# Patient Record
Sex: Female | Born: 1995 | Race: Black or African American | Hispanic: No | Marital: Single | Smoking: Never smoker
Health system: Southern US, Community
[De-identification: ages and names within clinical notes are randomized; demographics above are authoritative.]

## PROBLEM LIST (undated history)

## (undated) ENCOUNTER — Inpatient Hospital Stay (HOSPITAL_COMMUNITY): Payer: Self-pay

## (undated) DIAGNOSIS — E119 Type 2 diabetes mellitus without complications: Secondary | ICD-10-CM

## (undated) HISTORY — DX: Type 2 diabetes mellitus without complications: E11.9

## (undated) HISTORY — PX: NO PAST SURGERIES: SHX2092

---

## 2017-07-10 LAB — HEMOGLOBIN A1C: Hemoglobin A1C: 13.4

## 2017-07-10 LAB — TSH: TSH: 2 (ref 0.41–5.90)

## 2017-07-10 LAB — BASIC METABOLIC PANEL: Creatinine: 0.8 (ref 0.5–1.1)

## 2017-07-31 ENCOUNTER — Encounter: Payer: 59 | Attending: Nurse Practitioner | Admitting: Registered"

## 2017-07-31 ENCOUNTER — Encounter: Payer: Self-pay | Admitting: Registered"

## 2017-07-31 DIAGNOSIS — Z713 Dietary counseling and surveillance: Secondary | ICD-10-CM | POA: Insufficient documentation

## 2017-07-31 DIAGNOSIS — E119 Type 2 diabetes mellitus without complications: Secondary | ICD-10-CM | POA: Insufficient documentation

## 2017-07-31 NOTE — Patient Instructions (Signed)
Goals:  Follow Diabetes Meal Plan as instructed  Eat 3 meals and 2 snacks, every 3-5 hrs  Limit carbohydrate intake to 30-45 grams carbohydrate/meal  Limit carbohydrate intake to 15 grams carbohydrate/snack  Add lean protein foods to meals/snacks  Monitor glucose levels as instructed by your doctor  Aim for 30 mins of physical activity daily  Bring food record and glucose log to your next nutrition visit  Follow up yearly with registered dietitian

## 2017-07-31 NOTE — Progress Notes (Signed)
Diabetes Self-Management Education  Visit Type:  First/Initial  Appt. Start Time: 11:15 Appt. End Time: 12:30  07/31/2017  [name], identified by name and date of birth, is a 22 y.o. female with a diagnosis of Diabetes: Type 2.    Pt expectations: wants to know the best way to make changes to a better way of eating with diabetes.    Meter Provided:  Yes  If Yes, Brand: AccuCheck  Lot #: [id number] Expiration Date: 08/09/2018 Checked blood sugar in office: 223  ASSESSMENT  There were no vitals taken for this visit. There is no height or weight on file to calculate BMI.   Diabetes Self-Management Education - 07/31/17 1122      Health Coping   How would you rate your overall health?  Fair      Psychosocial Assessment   Patient Belief/Attitude about Diabetes  Motivated to manage diabetes    Self-care barriers  None    Self-management support  Friends;Family    Special Needs  None    Preferred Learning Style  No preference indicated    Learning Readiness  Ready      Complications   Last HgB A1C per patient/outside source  13.4 %    How often do you check your blood sugar?  0 times/day (not testing)    Number of hypoglycemic episodes per month  0    Number of hyperglycemic episodes per week  0    Have you had a dilated eye exam in the past 12 months?  No    Have you had a dental exam in the past 12 months?  No    Are you checking your feet?  Yes    How many days per week are you checking your feet?  7      Dietary Intake   Breakfast  skips    Snack (morning)  none    Lunch  sometimes skips; Fast food - chicken tenders, fries    Snack (afternoon)  sometimes cheddar popcorn    Dinner   Fast food - chicken tenders, fries or granola bar, water    Snack (evening)  none    Beverage(s)  water, juice, sprite      Exercise   Exercise Type  ADL's    How many days per week to you exercise?  0    How many minutes per day do you exercise?  0    Total minutes per week  of exercise  0      Patient Education   Previous Diabetes Education  No    Disease state   Definition of diabetes, type 1 and 2, and the diagnosis of diabetes;Factors that contribute to the development of diabetes    Nutrition management   Carbohydrate counting;Role of diet in the treatment of diabetes and the relationship between the three main macronutrients and blood glucose level;Effects of alcohol on blood glucose and safety factors with consumption of alcohol.;Reviewed blood glucose goals for pre and post meals and how to evaluate the patients' food intake on their blood glucose level.;Meal options for control of blood glucose level and chronic complications.    Physical activity and exercise   Role of exercise on diabetes management, blood pressure control and cardiac health.    Monitoring  Taught/evaluated SMBG meter.;Purpose and frequency of SMBG.;Taught/discussed recording of test results and interpretation of SMBG.;Interpreting lab values - A1C, lipid, urine microalbumina.;Identified appropriate SMBG and/or A1C goals.;Daily foot exams;Yearly dilated eye exam  Acute complications  Taught treatment of hypoglycemia - the 15 rule.;Discussed and identified patients' treatment of hyperglycemia.    Chronic complications  Relationship between chronic complications and blood glucose control;Lipid levels, blood glucose control and heart disease;Dental care;Nephropathy, what it is, prevention of, the use of ACE, ARB's and early detection of through urine microalbumia.;Assessed and discussed foot care and prevention of foot problems;Retinopathy and reason for yearly dilated eye exams;Reviewed with patient heart disease, higher risk of, and prevention    Psychosocial adjustment  Role of stress on diabetes;Helped patient identify a support system for diabetes management    Preconception care  Role of family planning for patients with diabetes    Personal strategies to promote health  Lifestyle issues that  need to be addressed for better diabetes care      Individualized Goals (developed by patient)   Nutrition  Follow meal plan discussed;General guidelines for healthy choices and portions discussed    Medications  take my medication as prescribed    Monitoring   test my blood glucose as discussed    Reducing Risk  treat hypoglycemia with 15 grams of carbs if blood glucose less than /dL;do foot checks daily;examine blood glucose patterns    Health Coping  Not Applicable      Post-Education Assessment   Patient understands the diabetes disease and treatment process.  Demonstrates understanding / competency    Patient understands incorporating nutritional management into lifestyle.  Demonstrates understanding / competency    Patient undertands incorporating physical activity into lifestyle.  Demonstrates understanding / competency    Patient understands using medications safely.  Demonstrates understanding / competency    Patient understands monitoring blood glucose, interpreting and using results  Demonstrates understanding / competency    Patient understands prevention, detection, and treatment of acute complications.  Demonstrates understanding / competency    Patient understands prevention, detection, and treatment of chronic complications.  Demonstrates understanding / competency    Patient understands how to develop strategies to address psychosocial issues.  Demonstrates understanding / competency    Patient understands how to develop strategies to promote health/change behavior.  Demonstrates understanding / competency      Outcomes   Program Status  Completed       Learning Objective:  Patient will have a greater understanding of diabetes self-management. Patient education plan is to attend individual and/or group sessions per assessed needs and concerns.   Plan:   Patient Instructions  Goals:  Follow Diabetes Meal Plan as instructed  Eat 3 meals and 2 snacks, every 3-5  hrs  Limit carbohydrate intake to 30-45 grams carbohydrate/meal  Limit carbohydrate intake to 15 grams carbohydrate/snack  Add lean protein foods to meals/snacks  Monitor glucose levels as instructed by your doctor  Aim for 30 mins of physical activity daily  Bring food record and glucose log to your next nutrition visit  Follow up yearly with registered dietitian     Expected Outcomes:  Demonstrated interest in learning. Expect positive outcomes  Education material provided: Living Well with Diabetes, Support group flyer and Carbohydrate counting sheet  If problems or questions, patient to contact team via:  Phone and Email  Future DSME appointment: - Yearly

## 2017-09-05 ENCOUNTER — Encounter: Payer: Self-pay | Admitting: Endocrinology

## 2017-09-05 ENCOUNTER — Ambulatory Visit (INDEPENDENT_AMBULATORY_CARE_PROVIDER_SITE_OTHER): Payer: 59 | Admitting: Endocrinology

## 2017-09-05 VITALS — BP 138/82 | HR 94 | Ht 63.0 in | Wt 219.6 lb

## 2017-09-05 DIAGNOSIS — E1165 Type 2 diabetes mellitus with hyperglycemia: Secondary | ICD-10-CM | POA: Diagnosis not present

## 2017-09-05 LAB — BASIC METABOLIC PANEL
BUN: 7 mg/dL (ref 6–23)
CALCIUM: 9.6 mg/dL (ref 8.4–10.5)
CO2: 24 mEq/L (ref 19–32)
CREATININE: 0.76 mg/dL (ref 0.40–1.20)
Chloride: 103 mEq/L (ref 96–112)
GFR: 121.91 mL/min (ref >60.00)
GLUCOSE: 136 mg/dL — AB (ref 70–99)
Potassium: 4.5 mEq/L (ref 3.5–5.1)
SODIUM: 136 meq/L (ref 135–145)

## 2017-09-05 MED ORDER — METFORMIN HCL ER 500 MG PO TB24: 2000.0000 mg | ORAL_TABLET | Freq: Every day | ORAL | 3 refills | Status: DC

## 2017-09-05 MED ORDER — GLYBURIDE 2.5 MG PO TABS: 2.5000 mg | ORAL_TABLET | Freq: Every day | ORAL | 0 refills | Status: DC

## 2017-09-05 NOTE — Patient Instructions (Addendum)
Check blood sugars on waking up  daily  Also check blood sugars about 1-2 hours after each meal and do this after different meals by rotation  Recommended blood sugar levels on waking up is 70-90  and about 2 hours after meal is 110-130   Please bring your blood sugar monitor to each visit, thank you  Walking daily  Start taking Metformin 500 mg, 1 tablet with your main meal for 3-5 days. Occasionally this may initially cause loose stools or nausea. If  tolerating well after 3-5 days add a second Metformin tablet (500 mg) at the same time. Continue adding another tablet after 5 days days if no persistent nausea or diarrhea until reaching the maximum tolerated dose or the full dose of 4 tablets once daily.  Protein in am  Bedtime snack daily  Call if sugars still higher than above in 10 days or not tolerating Metformin

## 2017-09-05 NOTE — Progress Notes (Signed)
Patient ID: Angela Barry, female   DOB: 05/15/1995, 22 y.o.   MRN: [id number]          Reason for Appointment: Consultation for Type 2 Diabetes  Referring PCP: Sharion Dove FNP   History of Present Illness:          Date of diagnosis of type 2 diabetes mellitus:   06/2017      Background history:   She had [facility] and because of her family history of diabetes she had screening labs done which showed her blood sugar to be 310 and A1c of 15.4 She had some symptoms of increased thirst but not unusual fatigue, weight loss, blurred vision or excessive urination She was started on metformin and referred here  Recent history:    Non-insulin hypoglycemic drugs the patient is taking are: None  Current management, blood sugar patterns and problems identified:  Patient was diagnosed to have early pregnancy and is now [redacted] weeks pregnant  Apparently her [facility] told her to stop metformin at this point but no other treatment has been started   No further records are available, she has also been seen by nurse practitioner at the obstetric clinic with the [facility]  Also she was straining to have mild diarrhea with taking metformin although she was taking 850 mg in the morning on empty stomach  Patient has been instructed on taking her blood sugars with an Accu-Chek but she is checking her blood sugars only sporadically and none for the last week  She thinks her blood sugars have been about 200 and at the most coming down to 150  She has seen the dietitian about 4 weeks ago for meal planning  Subsequently has been eating out less but has not cut out regular soft drinks such as Sprite  She has not had any excessive thirst or urination recently  Blood sugar in the office today fasting was 133   Currently has poor understanding for diabetes and blood sugar targets especially during pregnancy        Side effects from  medications have been: Diarrhea from metformin  Compliance with the medical regimen: Poor   Glucose monitoring:  done <1 times a day         Glucometer:  Accu-Chek       Blood Glucose readings as above  Self-care: The diet that the patient has been following is: None    Meal times are:  dinner: 6 PM   Typical meal intake: Breakfast is none               Dietician visit, most recent:07/31/2017               Exercise:  Some walking, mostly going to work, 30 min about 2 or 3 times a week, not briskly  Weight history: Previous range 210-240  Wt Readings from Last 3 Encounters:  09/05/17 219 lb 9.6 oz (99.6 kg)    Glycemic control:    Lab Results  Component Value Date   HGBA1C 13.4 07/10/2017   Lab Results  Component Value Date   CREATININE 0.8 07/10/2017   No results found for: MICRALBCREAT  No results found for: FRUCTOSAMINE    Allergies as of 09/05/2017   Not on File     Medication List        Accurate as of 09/05/17  1:00 PM. Always use your most recent med list.  glyBURIDE 2.5 MG tablet Commonly known as:  DIABETA Take 1 tablet (2.5 mg total) by mouth daily with supper.   metFORMIN 500 MG 24 hr tablet Commonly known as:  GLUCOPHAGE-XR Take 4 tablets (2,000 mg total) by mouth daily with supper.   sertraline 50 MG tablet Commonly known as:  ZOLOFT Take 50 mg by mouth daily.       Allergies: Not on File  Past Medical History:  Diagnosis Date  . Diabetes mellitus without complication (HCC)     History reviewed. No pertinent surgical history.  Family History  Problem Relation Age of Onset  . Diabetes Father   . Diabetes Paternal Grandmother   . Hypertension Neg Hx     Social History:  reports that she has never smoked. She has never used smokeless tobacco. She reports that she drank alcohol. She reports that she does not use drugs.   Review of Systems  Constitutional: Negative for weight gain.  HENT: Negative for headaches.     Eyes: Negative for blurred vision.  Respiratory: Negative for shortness of breath.   Cardiovascular: Negative for leg swelling.  Gastrointestinal: Negative for nausea.  Endocrine: Negative for fatigue.  Genitourinary: Negative for frequency.  Musculoskeletal: Negative for joint pain.  Skin: Negative for rash.  Neurological: Negative for numbness and tingling.     Lipid history: No levels available   No results found for: CHOL, HDL, LDLCALC, LDLDIRECT, TRIG, CHOLHDL         Hypertension: No history of this  BP Readings from Last 3 Encounters:  09/05/17 138/82     Most recent foot exam: 6/19    LABS:  Office Visit on 09/05/2017  Component Date Value Ref Range Status  . Creatinine 07/10/2017 0.8  0.5 - 1.1 Final  . Hemoglobin A1C 07/10/2017 13.4   Final  . TSH 07/10/2017 2.00  0.41 - 5.90 Final    Physical Examination:  BP 138/82 (BP Location: Left Arm, Patient Position: Sitting, Cuff Size: Normal)   Pulse 94   Ht 5\' 3"  (1.6 m)   Wt 219 lb 9.6 oz (99.6 kg)   SpO2 96%   BMI 38.90 kg/m   GENERAL:         Patient has generalized obesity.    HEENT:         Eye exam shows normal external appearance.  Fundus exam shows no retinopathy.  Oral exam shows normal mucosa .  NECK:  She has moderate acanthosis of the posterior neck There is no lymphadenopathy Thyroid is not enlarged and no nodules felt.  Carotid exam shows no bruit   LUNGS:         Chest is symmetrical. Lungs are clear to auscultation.Marland Kitchen.   HEART:         Heart sounds:  S1 and S2 are normal. No murmur or click heard., no S3 or S4.   ABDOMEN:   There is no distention present. Liver and spleen are not palpable. No other mass or tenderness present.    NEUROLOGICAL:   Ankle jerks are absent bilaterally.    Diabetic Foot Exam - Simple   No data filed             Vibration sense is  reduced in distal first toes. MUSCULOSKELETAL:  There is no swelling or deformity of the peripheral joints.      EXTREMITIES:     There is no edema.  SKIN:       No rash  ASSESSMENT:  Diabetes type 2, recently diagnosed with baseline A1c 13.4  Currently on no medication for her diabetes Although her blood sugar was significantly higher in the 300 range diagnosis about 2 months ago her glucose is only 133 today She has been monitoring her blood sugar very sporadically in the morning only which reportedly readings as high as 200  Although she  has had a consultation with the dietitian she is really not planning her meals well and not cutting out regular soft drinks Currently with her pregnancy she is mostly eating 2 meals a day and no bedtime snack  Complications of diabetes: None evident   PLAN:    She will need to start checking her blood sugars at least 2-3 times a day including 1 to 2 hours after meals  Cut back on regular soft drinks  Follow-up consultation with dietitian with meal planning directed towards diet during pregnancy  Start regular walking for exercise  She will restart metformin but try extended release metformin 500 mg with gradually increasing doses until maximally tolerated dose reached or 1500 mg at least  Trial of glyburide 2.5 mg daily at suppertime to start with  However discussed that if she has fluctuating or consistently high readings especially fasting above 90 she will need to start basal insulin  Check fructosamine to establish current level of control and A1c on the next visit  She will call if blood sugars are persistently high over the next 10 days and start insulin with the help of the nurse educator, discussed that this may well be necessary as her pregnancy advances and her blood sugars go up  Patient Instructions  Check blood sugars on waking up  daily  Also check blood sugars about 1-2 hours after each meal and do this after different meals by rotation  Recommended blood sugar levels on waking up is 70-90  and about 2 hours after meal is  110-130   Please bring your blood sugar monitor to each visit, thank you  Walking daily  Start taking Metformin 500 mg, 1 tablet with your main meal for 3-5 days. Occasionally this may initially cause loose stools or nausea. If  tolerating well after 3-5 days add a second Metformin tablet (500 mg) at the same time. Continue adding another tablet after 5 days days if no persistent nausea or diarrhea until reaching the maximum tolerated dose or the full dose of 4 tablets once daily.  Protein in am  Bedtime snack daily  Call if sugars still higher than above in 10 days or not tolerating Metformin  Counseling time on subjects discussed in assessment and plan sections is over 50% of today's 60 minute visit   Consultation note has been sent to the referring physician  Reather Littler 09/05/2017, 1:00 PM   Note: This office note was prepared with Dragon voice recognition system technology. Any transcriptional errors that result from this process are unintentional.

## 2017-09-06 LAB — FRUCTOSAMINE: Fructosamine: 273 umol/L (ref 0–285)

## 2017-09-12 ENCOUNTER — Ambulatory Visit: Payer: 59 | Admitting: Endocrinology

## 2017-09-16 ENCOUNTER — Telehealth: Payer: Self-pay | Admitting: Dietician

## 2017-09-16 ENCOUNTER — Encounter: Payer: 59 | Admitting: Dietician

## 2017-09-16 NOTE — Telephone Encounter (Signed)
Patient did not show up for her nutrition appointment today. Called but unable to leave a message.  Oran ReinLaura Myrlene Riera, RD, LDN, CDE

## 2017-09-18 ENCOUNTER — Other Ambulatory Visit: Payer: Self-pay

## 2017-09-18 ENCOUNTER — Telehealth: Payer: Self-pay | Admitting: Dietician

## 2017-09-18 MED ORDER — LANCETS MISC: 3 refills | Status: DC

## 2017-09-18 NOTE — Telephone Encounter (Signed)
Brief Nutrition Note Called patient due to her missing her nutrition appointment Tuesday.  She stated that she knew that she had an appointment but did not know when.  We rescheduled this for tomorrow at 11:45. She is pregnant with uncontrolled blood sugars in the high 100's.  Newly diagnosed type 2 diabetes. Will message on-call endocrinologies. She is not yet scheduled with an OB due to insurance issues.  Oran ReinLaura [place], RD, LDN, CDE

## 2017-09-19 ENCOUNTER — Other Ambulatory Visit: Payer: Self-pay

## 2017-09-19 ENCOUNTER — Encounter: Payer: Self-pay | Admitting: Dietician

## 2017-09-19 ENCOUNTER — Telehealth: Payer: Self-pay | Admitting: Dietician

## 2017-09-19 ENCOUNTER — Encounter: Payer: 59 | Attending: Nurse Practitioner | Admitting: Dietician

## 2017-09-19 DIAGNOSIS — E119 Type 2 diabetes mellitus without complications: Secondary | ICD-10-CM | POA: Insufficient documentation

## 2017-09-19 DIAGNOSIS — O24119 Pre-existing diabetes mellitus, type 2, in pregnancy, unspecified trimester: Secondary | ICD-10-CM

## 2017-09-19 DIAGNOSIS — Z713 Dietary counseling and surveillance: Secondary | ICD-10-CM | POA: Diagnosis present

## 2017-09-19 MED ORDER — INSULIN GLARGINE 100 UNIT/ML SOLOSTAR PEN: 10.0000 [IU] | PEN_INJECTOR | Freq: Every day | SUBCUTANEOUS | 1 refills | Status: DC

## 2017-09-19 MED ORDER — GLUCOSE BLOOD VI STRP: ORAL_STRIP | 3 refills | Status: DC

## 2017-09-19 MED ORDER — BASAGLAR KWIKPEN 100 UNIT/ML ~~LOC~~ SOPN: 10.0000 [IU] | PEN_INJECTOR | Freq: Every day | SUBCUTANEOUS | 1 refills | Status: DC

## 2017-09-19 NOTE — Patient Instructions (Addendum)
Call your student [facility] and get a referral for the [facility] clinic at [facility]. [phone] You need a prescription for the Accu Chek Guide Fast Clicks drums Call your psychiatrist today and let them know about your symptoms. Make an appointment with a counselor  Walk or swim daily. Put the Calorie Brooke DareKing App on your phone Take your medication as prescribed. Eat 3 meals and 3 snacks daily.  See your meal plan card for portions. Each meal and snack needs to have carbohydrate and protein.

## 2017-09-19 NOTE — Telephone Encounter (Signed)
Brief Nutrition Note: Called patient and stated that the prescription for the Drums for her lancing devise and insulin have been called into the pharmacy.  Also, told her to start taking the Metformin again.  Insulin instructions:  Take 10 units Basaglar each night at about the same time.  Increase 2 units every 2 days until the morning fasting blood sugar is less than 100 and her blood sugar 2 hours after her meals is less than 130.   Patient to call if questions.  Oran ReinLaura Uel David[place], RD, LDN, CDE

## 2017-09-21 NOTE — Progress Notes (Signed)
Diabetes Self-Management Education  Visit Type: Follow-up  Appt. Start Time: 1200 Appt. End Time: 1330  09/21/2017  [name], identified by name and date of birth, is a 22 y.o. female with a diagnosis of Diabetes: Type 2. Since last visit with RD 1 month ago, patient  Is now [redacted] weeks pregnant.  She was seen at [facility] and was told to stop taking the Metformin as it was not appropriate during pregnancy.  She did not take the glyburide as she did not have the money.  Her fasting blood sugar has been 120-150 and post meal >200 when she checked earlier in the week.  Currently she is out of drums for the lancing device.    She missed her appointment earlier this week.  She is seen by Dr. Lucianne MussKumar and discussed with endocrinologist on call.  [name]  [name]  She works at [employer].  Her father has diabetes and is on an injectable medication.  She has no OB yet and just got Pregnancy Medicaid.    Prescription called in for Basaglar 10 units each HS and increase 2 units every 2 days until fasting CBG <100 and CBG 2 hours after meals is <130.  She is to resume the Metformin.  She is going to see if her mother can help with the cost of the Glyburide.   Pincus LargeBeverly Barry, IowaRD, CDE completed insulin instruction with patient including differences between long and short acting insulin, rotation of injection sites, and injection.  Patient demonstrated injection and verbalized understanding.  Called patient to let her know of available prescription at her pharmacy.  Gave her information to obtain OB. Also patient noted feelings of self harm but verbalized that she did not have these thoughts today.  Told her to call her psychiatrist and [facility] number provided.  Her antidepressant was recently changed due to  pregnancy.  ASSESSMENT  Weight 220 lb (99.8 kg). Body mass index is 38.97 kg/m.  Diabetes Self-Management Education - 09/21/17 0842      Visit Information   Visit Type  Follow-up      Initial Visit   Diabetes Type  Type 2    Are you currently following a meal plan?  No    Are you taking your medications as prescribed?  No    Date Diagnosed  07/14/17      Psychosocial Assessment   Patient Belief/Attitude about Diabetes  Motivated to manage diabetes but barriers    Patient Concerns  Nutrition/Meal planning;Other (comment);Glycemic Control diabetes and pregnancy    Special Needs  None    Preferred Learning Style  No preference indicated    Learning Readiness  Ready    How often do you need to have someone help you when you read instructions, pamphlets, or other written materials from your doctor or pharmacy?  1 - Never    What is the last grade level you completed in school?  just finished junior year of college      Pre-Education Assessment   Patient understands the diabetes disease and treatment process.  Needs Review    Patient understands incorporating nutritional management into lifestyle.  Needs Review    Patient undertands incorporating physical activity into lifestyle.  Needs Review    Patient understands using medications safely.  Needs Review    Patient understands monitoring  blood glucose, interpreting and using results  Needs Review    Patient understands prevention, detection, and treatment of acute complications.  Needs Review    Patient understands prevention, detection, and treatment of chronic complications.  Needs Review    Patient understands how to develop strategies to address psychosocial issues.  Needs Review    Patient understands how to develop strategies to promote health/change behavior.  Needs Review      Complications   Last HgB A1C per patient/outside source  13.4 % 06/2017    How often do you check your blood sugar?  0 times/day (not testing)  currently does not have drums for her lancing devise    Fasting Blood glucose range (mg/dL)  96-045;[phone] last week    Postprandial Blood glucose range (mg/dL)  >956 last week    Number of hypoglycemic episodes per month  0    Number of hyperglycemic episodes per week  14      Dietary Intake   Breakfast  Hot pocket, 6 pack of PB crackers    Snack (morning)  none    Lunch  fast food (chicken strips and fries)    Snack (afternoon)  yogurt    Dinner  spaghetti and meat balls, veges OR chicken, potatoes OR salad    Snack (evening)  popcorn    Beverage(s)  water, powerade, regular sprite, whole milk      Exercise   Exercise Type  ADL's      Patient Education   Previous Diabetes Education  Yes (please comment) 1 month ago    Nutrition management   Role of diet in the treatment of diabetes and the relationship between the three main macronutrients and blood glucose level;Food label reading, portion sizes and measuring food.;Meal timing in regards to the patients' current diabetes medication.;Information on hints to eating out and maintain blood glucose control.;Carbohydrate counting    Physical activity and exercise   Role of exercise on diabetes management, blood pressure control and cardiac health.;Identified with patient nutritional and/or medication changes necessary with exercise.    Medications  Reviewed patients medication for diabetes, action, purpose, timing of dose and side effects.    Monitoring  Purpose and frequency of SMBG.    Acute complications  Taught treatment of hypoglycemia - the 15 rule.    Psychosocial adjustment  Worked with patient to identify barriers to care and solutions    Preconception care  Reviewed with patient blood glucose goals with pregnancy    Personal strategies to promote health  Lifestyle issues that need to be addressed for better diabetes care      Individualized Goals (developed by patient)   Nutrition  Follow meal plan discussed;General  guidelines for healthy choices and portions discussed    Physical Activity  Exercise 3-5 times per week;30 minutes per day    Medications  take my medication as prescribed    Monitoring   test my blood glucose as discussed    Problem Solving  taking medication, checking blood sugar    Reducing Risk  examine blood glucose patterns    Health Coping  discuss diabetes with (comment) MD, Rd, CDE      Patient Self-Evaluation of Goals - Patient rates self as meeting previously set goals (% of time)   Nutrition  25 - 50%    Physical Activity  < 25%    Medications  < 25%    Problem Solving  < 25%    Reducing Risk  < 25%  Health Coping  >75%      Post-Education Assessment   Patient understands the diabetes disease and treatment process.  Demonstrates understanding / competency    Patient understands incorporating nutritional management into lifestyle.  Demonstrates understanding / competency    Patient undertands incorporating physical activity into lifestyle.  Demonstrates understanding / competency    Patient understands using medications safely.  Demonstrates understanding / competency    Patient understands monitoring blood glucose, interpreting and using results  Demonstrates understanding / competency    Patient understands prevention, detection, and treatment of acute complications.  Demonstrates understanding / competency    Patient understands prevention, detection, and treatment of chronic complications.  Demonstrates understanding / competency    Patient understands how to develop strategies to address psychosocial issues.  Demonstrates understanding / competency    Patient understands how to develop strategies to promote health/change behavior.  Demonstrates understanding / competency      Outcomes   Expected Outcomes  Other (comment) demonstrates interest in learning but question followthrough     Future DMSE  PRN    Program Status  Completed      Subsequent Visit   Since your  last visit have you continued or begun to take your medications as prescribed?  No       Individualized Plan for Diabetes Self-Management Training:   Learning Objective:  Patient will have a greater understanding of diabetes self-management. Patient education plan is to attend individual and/or group sessions per assessed needs and concerns.   Plan:   Patient Instructions  Call your student health center and get a referral for the high risk clinic at [place]. [phone] You need a prescription for the Accu Chek Guide Fast Clicks drums Call your psychiatrist today and let them know about your symptoms. Make an appointment with a counselor  Walk or swim daily. Put the Calorie Brooke Dare App on your phone Take your medication as prescribed. Eat 3 meals and 3 snacks daily.  See your meal plan card for portions. Each meal and snack needs to have carbohydrate and protein.         Expected Outcomes:  Other (comment)(demonstrates interest in learning but question followthrough )  Education material provided: Meal plan card, Diabetes during pregnancy  If problems or questions, patient to contact team via:  Phone  Future DSME appointment: PRN

## 2017-09-23 ENCOUNTER — Telehealth: Payer: Self-pay | Admitting: Dietician

## 2017-09-23 NOTE — Telephone Encounter (Signed)
Brief Nutrition Note Called patient this afternoon to follow up.  She reports that she is taking 10 units of Basaglar.  Her am blood sugar was 179.  Instructed her to increase the Basaglar to 12 units tonight and increase it 2 units every couple of days until her am blood sugar was <100.  Patient verbalized understanding.  [facility] but is not yet established as a patient but she states that it is in process.  She had no questions at this time.  Oran ReinLaura Dawnita Molner, RD, LDN, CDE

## 2017-09-25 ENCOUNTER — Encounter: Payer: Self-pay | Admitting: Certified Nurse Midwife

## 2017-09-25 ENCOUNTER — Other Ambulatory Visit (HOSPITAL_COMMUNITY)
Admission: RE | Admit: 2017-09-25 | Discharge: 2017-09-25 | Disposition: A | Discharge status: Home / Self Care | Payer: 59 | Source: Ambulatory Visit | Attending: Obstetrics & Gynecology | Admitting: Obstetrics & Gynecology

## 2017-09-25 ENCOUNTER — Ambulatory Visit (INDEPENDENT_AMBULATORY_CARE_PROVIDER_SITE_OTHER): Payer: 59 | Admitting: Certified Nurse Midwife

## 2017-09-25 VITALS — BP 136/87 | HR 105 | Wt 223.7 lb

## 2017-09-25 DIAGNOSIS — B373 Candidiasis of vulva and vagina: Secondary | ICD-10-CM

## 2017-09-25 DIAGNOSIS — O99212 Obesity complicating pregnancy, second trimester: Secondary | ICD-10-CM

## 2017-09-25 DIAGNOSIS — Z3A00 Weeks of gestation of pregnancy not specified: Secondary | ICD-10-CM | POA: Diagnosis not present

## 2017-09-25 DIAGNOSIS — O0992 Supervision of high risk pregnancy, unspecified, second trimester: Secondary | ICD-10-CM

## 2017-09-25 DIAGNOSIS — O0990 Supervision of high risk pregnancy, unspecified, unspecified trimester: Secondary | ICD-10-CM | POA: Diagnosis present

## 2017-09-25 DIAGNOSIS — E1165 Type 2 diabetes mellitus with hyperglycemia: Secondary | ICD-10-CM

## 2017-09-25 DIAGNOSIS — O99210 Obesity complicating pregnancy, unspecified trimester: Secondary | ICD-10-CM | POA: Insufficient documentation

## 2017-09-25 DIAGNOSIS — B3731 Acute candidiasis of vulva and vagina: Secondary | ICD-10-CM

## 2017-09-25 MED ORDER — INSULIN PEN NEEDLE 30G X 8 MM MISC: 1.0000 | Status: DC | PRN

## 2017-09-25 MED ORDER — ASPIRIN 81 MG PO CHEW: 81.0000 mg | CHEWABLE_TABLET | Freq: Every day | ORAL | 12 refills | Status: DC

## 2017-09-25 MED ORDER — OB COMPLETE PETITE 35-5-1-200 MG PO CAPS: 1.0000 | ORAL_CAPSULE | Freq: Every day | ORAL | 12 refills | Status: DC

## 2017-09-25 MED ORDER — TERCONAZOLE 0.8 % VA CREA: 1.0000 | TOPICAL_CREAM | Freq: Every day | VAGINAL | 0 refills | Status: DC

## 2017-09-25 MED ORDER — FLUCONAZOLE 150 MG PO TABS: 150.0000 mg | ORAL_TABLET | Freq: Once | ORAL | 0 refills | Status: AC

## 2017-09-25 NOTE — Progress Notes (Signed)
Subjective:   Angela Barry is a 22 y.o. G2P0010 at 50w3dby LMP being seen today for her first obstetrical visit.  Her obstetrical history is significant for obesity and DM2; [facility] Patient does intend to breast feed. Pregnancy history fully reviewed.  Patient reports no complaints.  HISTORY: OB History  Gravida Para Term Preterm AB Living  2 0 0 0 1 0  SAB TAB Ectopic Multiple Live Births  1 0 0 0 0    # Outcome Date GA Lbr Len/2nd Weight Sex Delivery Anes PTL Lv  2 Current           1 SAB  552w0d         Last pap smear was done unknown.   Past Medical History:  Diagnosis Date  . Diabetes mellitus without complication ([facility]   History reviewed. No pertinent surgical history. Family History  Problem Relation Age of Onset  . Diabetes Father   . Diabetes Paternal Grandmother   . Diabetes Paternal Grandfather   . Hypertension Neg Hx    Social History   Tobacco Use  . Smoking status: Never Smoker  . Smokeless tobacco: Never Used  Substance Use Topics  . Alcohol use: Not Currently  . Drug use: Never   No Known Allergies Current Outpatient Medications on File Prior to Visit  Medication Sig Dispense Refill  . Prenatal Vit w/Fe-Methylfol-FA (PNV PO) Take 1 tablet by mouth.    . Marland Kitchenlucose blood (ACCU-CHEK COMPACT TEST DRUM) test strip Use as instructed to check blood sugar 3 times daily. (Patient not taking: Reported on 09/25/2017) 100 each 3  . glyBURIDE (DIABETA) 2.5 MG tablet Take 1 tablet (2.5 mg total) by mouth daily with supper. (Patient not taking: Reported on 09/19/2017) 30 tablet 0  . Insulin Glargine (BASAGLAR KWIKPEN) 100 UNIT/ML SOPN Inject 0.1 mLs (10 Units total) into the skin at bedtime. (Patient not taking: Reported on 09/25/2017) 15 mL 1  . Lancets MISC Use to check blood sugar as instructed. (Patient not taking: Reported on 09/25/2017) 200 each 3  . metFORMIN (GLUCOPHAGE-XR) 500 MG 24 hr tablet Take 4 tablets (2,000  mg total) by mouth daily with supper. (Patient not taking: Reported on 09/19/2017) 120 tablet 3  . sertraline (ZOLOFT) 50 MG tablet Take 50 mg by mouth daily.   1   No current facility-administered medications on file prior to visit.     Review of Systems Pertinent items noted in HPI and remainder of comprehensive ROS otherwise negative.  Exam   Vitals:   09/25/17 1439  BP: 136/87  Pulse: (!) 105  Weight: 223 lb 11.2 oz (101.5 kg)   Fetal Heart Rate (bpm): 184; doppler  Uterus:     Pelvic Exam: Perineum: no hemorrhoids, normal perineum   Vulva: normal external genitalia, no lesions   Vagina:  normal mucosa, normal discharge   Cervix: no lesions and normal, pap smear done.    Adnexa: normal adnexa and no mass, fullness, tenderness   Bony Pelvis: average  System: General: well-developed, well-nourished female in no acute distress   Breast:  normal appearance, no masses or tenderness   Skin: normal coloration and turgor, no rashes   Neurologic: oriented, normal, negative, normal mood   Extremities: normal strength, tone, and muscle mass, ROM of all joints is normal   HEENT PERRLA, extraocular movement intact and sclera clear, anicteric   Mouth/Teeth mucous membranes moist, pharynx normal without lesions and dental  hygiene good   Neck supple and no masses   Cardiovascular: regular rate and rhythm   Respiratory:  no respiratory distress, normal breath sounds   Abdomen: soft, non-tender; bowel sounds normal; no masses,  no organomegaly     Assessment:   Pregnancy: G2P0010 Patient Active Problem List   Diagnosis Date Noted  . Supervision of high risk pregnancy, antepartum 09/25/2017  . Obesity during pregnancy with antepartum complication 46/43/1427  . Uncontrolled type 2 diabetes mellitus with hyperglycemia, without long-term current use of insulin ([place]) 09/05/2017     Plan:  1. Supervision of high risk pregnancy, antepartum      - Culture, OB Urine - Obstetric Panel,  Including HIV - Cytology - PAP - Genetic Screening - Hemoglobin A1c - Hemoglobinopathy evaluation - Cervicovaginal ancillary only - Enroll Patient in Babyscripts - Vitamin D (25 hydroxy) - Amb Referral to Nutrition and Diabetic E - [place] MFM OB DETAIL +14 WK; Future - Inheritest Core(CF97,SMA,FraX) - Comp Met (CMET) - ECHO FETAL; Future - Prenat-FeCbn-FeAspGl-FA-Omega (OB COMPLETE PETITE) 35-5-1-200 MG CAPS; Take 1 tablet by mouth daily.  Dispense: 30 capsule; Refill: 12 - TSH Pregnancy  2. Obesity during pregnancy with antepartum complication     Has gained about 14lbs by her reported prepregnancy weight  3. Uncontrolled type 2 diabetes mellitus with hyperglycemia, without long-term current use of insulin ([facility])    - Hemoglobin A1c - [place] MFM OB DETAIL +14 WK; Future - Comp Met (CMET) - ECHO FETAL; Future - Protein / creatinine ratio, urine - Ambulatory referral to Ophthalmology - aspirin 81 MG chewable tablet; Chew 1 tablet (81 mg total) by mouth daily.  Dispense: 30 tablet; Refill: 12 - Insulin Pen Needle (NOVOFINE) 10 each  4. Yeast infection of the vagina     - fluconazole (DIFLUCAN) 150 MG tablet; Take 1 tablet (150 mg total) by mouth once for 1 dose.  Dispense: 1 tablet; Refill: 0 - terconazole (TERAZOL 3) 0.8 % vaginal cream; Place 1 applicator vaginally at bedtime.  Dispense: 20 g; Refill: 0   Initial labs drawn. Continue prenatal vitamins. Genetic Screening discussed, NIPS: ordered. Ultrasound discussed; fetal anatomic survey: ordered. Problem list reviewed and updated. The nature of [place] with multiple MDs and other Advanced Practice Providers was explained to patient; also emphasized that residents, students are part of our team. Routine obstetric precautions reviewed. Return in about 2 weeks (around 10/09/2017) for [facility].     Kandis Cocking, [facility], [place]

## 2017-09-25 NOTE — Progress Notes (Signed)
Pt presents for NOB visit. This is a planned pregnancy and she lives with FOB.

## 2017-09-26 LAB — PROTEIN / CREATININE RATIO, URINE
CREATININE, UR: 117.7 mg/dL
PROTEIN UR: 25.9 mg/dL
Protein/Creat Ratio: 220 mg/g creat — ABNORMAL HIGH (ref 0–200)

## 2017-09-26 LAB — CERVICOVAGINAL ANCILLARY ONLY
CHLAMYDIA, DNA PROBE: NEGATIVE
NEISSERIA GONORRHEA: NEGATIVE
Trichomonas: NEGATIVE

## 2017-09-29 LAB — COMPREHENSIVE METABOLIC PANEL
A/G RATIO: 1.9 (ref 1.2–2.2)
ALK PHOS: 61 IU/L (ref 39–117)
ALT: 9 IU/L (ref 0–32)
AST: 10 IU/L (ref 0–40)
Albumin: 4.6 g/dL (ref 3.5–5.5)
BILIRUBIN TOTAL: 0.3 mg/dL (ref 0.0–1.2)
BUN/Creatinine Ratio: 9 (ref 9–23)
BUN: 7 mg/dL (ref 6–20)
CHLORIDE: 102 mmol/L (ref 96–106)
CO2: 21 mmol/L (ref 20–29)
CREATININE: 0.79 mg/dL (ref 0.57–1.00)
Calcium: 9.4 mg/dL (ref 8.7–10.2)
GFR calc Af Amer: 123 mL/min/{1.73_m2} (ref >59)
GFR calc non Af Amer: 107 mL/min/{1.73_m2} (ref >59)
GLOBULIN, TOTAL: 2.4 g/dL (ref 1.5–4.5)
Glucose: 229 mg/dL — ABNORMAL HIGH (ref 65–99)
POTASSIUM: 4 mmol/L (ref 3.5–5.2)
SODIUM: 137 mmol/L (ref 134–144)
Total Protein: 7 g/dL (ref 6.0–8.5)

## 2017-09-29 LAB — VITAMIN D 25 HYDROXY (VIT D DEFICIENCY, FRACTURES): Vit D, 25-Hydroxy: 15.3 ng/mL — ABNORMAL LOW (ref 30.0–100.0)

## 2017-09-29 LAB — HEMOGLOBINOPATHY EVALUATION
HGB A: 98.3 % (ref 96.4–98.8)
HGB C: 0 %
HGB S: 0 %
HGB VARIANT: 0 %
Hemoglobin A2 Quantitation: 1.7 % — ABNORMAL LOW (ref 1.8–3.2)
Hemoglobin F Quantitation: 0 % (ref 0.0–2.0)

## 2017-09-29 LAB — OBSTETRIC PANEL, INCLUDING HIV
ANTIBODY SCREEN: NEGATIVE
BASOS: 1 %
Basophils Absolute: 0 10*3/uL (ref 0.0–0.2)
EOS (ABSOLUTE): 0.1 10*3/uL (ref 0.0–0.4)
Eos: 1 %
HEMATOCRIT: 41.7 % (ref 34.0–46.6)
HIV Screen 4th Generation wRfx: NONREACTIVE
Hemoglobin: 13.4 g/dL (ref 11.1–15.9)
Hepatitis B Surface Ag: NEGATIVE
Immature Grans (Abs): 0.1 10*3/uL (ref 0.0–0.1)
Immature Granulocytes: 1 %
LYMPHS ABS: 2 10*3/uL (ref 0.7–3.1)
Lymphs: 24 %
MCH: 29.3 pg (ref 26.6–33.0)
MCHC: 32.1 g/dL (ref 31.5–35.7)
MCV: 91 fL (ref 79–97)
MONOS ABS: 0.3 10*3/uL (ref 0.1–0.9)
Monocytes: 3 %
NEUTROS PCT: 70 %
Neutrophils Absolute: 6 10*3/uL (ref 1.4–7.0)
Platelets: 258 10*3/uL (ref 150–450)
RBC: 4.58 x10E6/uL (ref 3.77–5.28)
RDW: 13.8 % (ref 12.3–15.4)
RH TYPE: NEGATIVE
RPR Ser Ql: NONREACTIVE
Rubella Antibodies, IGG: 4.22 index (ref >0.99)
WBC: 8.4 10*3/uL (ref 3.4–10.8)

## 2017-09-29 LAB — URINE CULTURE, OB REFLEX

## 2017-09-29 LAB — CYTOLOGY - PAP
ADEQUACY: ABSENT
Diagnosis: NEGATIVE

## 2017-09-29 LAB — CULTURE, OB URINE

## 2017-09-29 LAB — HEMOGLOBIN A1C
Est. average glucose Bld gHb Est-mCnc: 189 mg/dL
Hgb A1c MFr Bld: 8.2 % — ABNORMAL HIGH (ref 4.8–5.6)

## 2017-09-29 LAB — TSH PREGNANCY: TSH PREGNANCY: 1.49 u[IU]/mL (ref 0.450–4.500)

## 2017-10-02 ENCOUNTER — Other Ambulatory Visit: Payer: Self-pay | Admitting: Endocrinology

## 2017-10-06 ENCOUNTER — Encounter: Payer: Self-pay | Admitting: *Deleted

## 2017-10-07 ENCOUNTER — Other Ambulatory Visit: Payer: 59

## 2017-10-07 ENCOUNTER — Other Ambulatory Visit: Payer: Self-pay | Admitting: Certified Nurse Midwife

## 2017-10-07 ENCOUNTER — Telehealth: Payer: Self-pay

## 2017-10-07 DIAGNOSIS — E559 Vitamin D deficiency, unspecified: Secondary | ICD-10-CM | POA: Insufficient documentation

## 2017-10-07 DIAGNOSIS — Z6791 Unspecified blood type, Rh negative: Secondary | ICD-10-CM | POA: Insufficient documentation

## 2017-10-07 DIAGNOSIS — O2342 Unspecified infection of urinary tract in pregnancy, second trimester: Secondary | ICD-10-CM

## 2017-10-07 DIAGNOSIS — O26899 Other specified pregnancy related conditions, unspecified trimester: Secondary | ICD-10-CM

## 2017-10-07 DIAGNOSIS — O0990 Supervision of high risk pregnancy, unspecified, unspecified trimester: Secondary | ICD-10-CM

## 2017-10-07 MED ORDER — SULFAMETHOXAZOLE-TRIMETHOPRIM 400-80 MG PO TABS: 1.0000 | ORAL_TABLET | Freq: Two times a day (BID) | ORAL | 0 refills | Status: DC

## 2017-10-07 MED ORDER — VITAMIN D (ERGOCALCIFEROL) 1.25 MG (50000 UNIT) PO CAPS: 50000.0000 [IU] | ORAL_CAPSULE | ORAL | 2 refills | Status: DC

## 2017-10-07 NOTE — Telephone Encounter (Signed)
-----   Message from Roe Coombsachelle A Denney, CNM sent at 10/07/2017 12:19 PM EDT ----- Please let her know that her vitamin D level is low.  Vitamin D weekly tablet has been sent to her pharmacy for her to take.  Thank you.  R.Denney CNM Please let her know that she has a UTI and bactim has been sent in for her to take.  Please also tell her that if she has any fever, increased back pain with N&V or decreased urine output to let us know. Thank you.

## 2017-10-07 NOTE — Progress Notes (Signed)
Patient was notified.

## 2017-10-07 NOTE — Telephone Encounter (Signed)
Patient was notified of results and Rx. 

## 2017-10-08 LAB — INHERITEST CORE(CF97,SMA,FRAX)

## 2017-10-10 ENCOUNTER — Encounter: Payer: Self-pay | Admitting: Endocrinology

## 2017-10-10 ENCOUNTER — Ambulatory Visit (INDEPENDENT_AMBULATORY_CARE_PROVIDER_SITE_OTHER): Payer: 59 | Admitting: Endocrinology

## 2017-10-10 ENCOUNTER — Other Ambulatory Visit: Payer: Self-pay

## 2017-10-10 VITALS — BP 130/80 | HR 98 | Ht 63.0 in | Wt 227.2 lb

## 2017-10-10 DIAGNOSIS — E1165 Type 2 diabetes mellitus with hyperglycemia: Secondary | ICD-10-CM | POA: Diagnosis not present

## 2017-10-10 MED ORDER — GLUCOSE BLOOD VI STRP: 1.0000 | ORAL_STRIP | 3 refills | Status: DC | PRN

## 2017-10-10 NOTE — Progress Notes (Signed)
Patient ID: Angela Barry, female   DOB: July 21, 1995, 22 y.o.   MRN: [id number]          Reason for Appointment: Consultation for Type 2 Diabetes  Referring PCP: Sharion Dove FNP   History of Present Illness:          Date of diagnosis of type 2 diabetes mellitus:   06/2017      Background history:   She had a [facility] and because of her family history of diabetes she had screening labs done which showed her blood sugar to be 310 and A1c of 15.4 She had some symptoms of increased thirst but not unusual fatigue, weight loss, blurred vision or excessive urination She was started on metformin and referred here  Recent history:    Non-insulin hypoglycemic drugs the patient is taking are: Metformin ER 500 mg twice daily, glyburide 2.5 mg at dinner INSULIN: Basaglar 4 units at bedtime  Current management, blood sugar patterns and problems identified:  Patient  is now [redacted] weeks pregnant  She was started on metformin and glyburide on her initial consultation in June and is here for follow-up  Although she has started checking her blood sugars he claims that she does not have a ride test strips and has only sporadic blood sugars  She was also told to increase her Metformin up to 4 daily but she is still taking only 2 tablets now  She was started on insulin when it was apparent that her blood sugars were still high at the visit with the diabetes educator  However even though she was told to start with 10 units and go up 2 units progressively she did not understand and was still taking only 2 units daily at night and finally last night she started taking 4 units  Her blood sugars in the last 2 weeks range from 124 up to 144 but previously as high as 219 after lunch        Side effects from medications have been: Diarrhea from metformin  Compliance with the medical regimen: Poor   Glucose monitoring:  done <1 times a day         Glucometer:   Accu-Chek       Blood Glucose readings as above  Self-care: The diet that the patient has been following is: None    Meal times are: Lunch 11am-1 pm  dinner: 6 PM   Typical meal intake: Breakfast is none               Dietician visit, most recent:07/31/2017               Exercise:  Some walking, mostly going to work, 30 min about 2 or 3 times a week, not briskly  Weight history: Previous range 210-240  Wt Readings from Last 3 Encounters:  10/10/17 227 lb 3.2 oz (103.1 kg)  09/25/17 223 lb 11.2 oz (101.5 kg)  09/19/17 220 lb (99.8 kg)    Glycemic control:    Lab Results  Component Value Date   HGBA1C 8.2 (H) 09/25/2017   HGBA1C 13.4 07/10/2017   Lab Results  Component Value Date   CREATININE 0.79 09/25/2017   No results found for: [facility]  Lab Results  Component Value Date   FRUCTOSAMINE 273 09/05/2017      Allergies as of 10/10/2017   No Known Allergies     Medication List        Accurate as of 10/10/17  12:44 PM. Always use your most recent med list.          BASAGLAR KWIKPEN 100 UNIT/ML Sopn Inject 0.1 mLs (10 Units total) into the skin at bedtime.   glucose blood test strip Commonly known as:  ACCU-CHEK COMPACT TEST DRUM Use as instructed to check blood sugar 3 times daily.   glucose blood test strip Commonly known as:  ACCU-CHEK GUIDE 1 each by Other route as needed for other. Use as instructed to check blood sugar 4 times daily   glyBURIDE 2.5 MG tablet Commonly known as:  DIABETA Take 1 tablet (2.5 mg total) by mouth daily with supper.   Lancets Misc Use to check blood sugar as instructed.   metFORMIN 500 MG 24 hr tablet Commonly known as:  GLUCOPHAGE-XR Take 4 tablets (2,000 mg total) by mouth daily with supper.   OB COMPLETE PETITE 35-5-1-200 MG Caps Take 1 tablet by mouth daily.   PNV PO Take 1 tablet by mouth.   sertraline 50 MG tablet Commonly known as:  ZOLOFT Take 50 mg by mouth daily.   Vitamin D (Ergocalciferol)  50000 units Caps capsule Commonly known as:  DRISDOL Take 1 capsule (50,000 Units total) by mouth every 7 (seven) days.       Allergies: No Known Allergies  Past Medical History:  Diagnosis Date  . Diabetes mellitus without complication (HCC)     History reviewed. No pertinent surgical history.  Family History  Problem Relation Age of Onset  . Diabetes Father   . Diabetes Paternal Grandmother   . Diabetes Paternal Grandfather   . Hypertension Neg Hx     Social History:  reports that she has never smoked. She has never used smokeless tobacco. She reports that she drank alcohol. She reports that she does not use drugs.   Review of Systems   Lipid history: No levels available   No results found for: CHOL, HDL, LDLCALC, LDLDIRECT, TRIG, CHOLHDL         Hypertension: No history of this  BP Readings from Last 3 Encounters:  10/10/17 130/80  09/25/17 136/87  09/05/17 138/82     Most recent foot exam: 6/19    LABS:  No visits with results within 1 Week(s) from this visit.  Latest known visit with results is:  Initial Prenatal on 09/25/2017  Component Date Value Ref Range Status  . Urine Culture, OB 09/25/2017 Final report   Final  . Hepatitis B Surface Ag 09/25/2017 Negative  Negative Final  . RPR Ser Ql 09/25/2017 Non Reactive  Non Reactive Final  . Rubella Antibodies, IGG 09/25/2017 4.22  Immune >0.99 index Final   Comment:                                 Non-immune       <0.90                                 Equivocal  0.90 - 0.99                                 Immune           >0.99   . ABO Grouping 09/25/2017 B   Final  . Rh Factor 09/25/2017 Negative   Final   Comment: Please  note: Prior records for this patient's ABO / Rh type are not available for additional verification.   Marland Kitchen Antibody Screen 09/25/2017 Negative  Negative Final  . HIV Screen 4th Generation wRfx 09/25/2017 Non Reactive  Non Reactive Final  . WBC 09/25/2017 8.4  3.4 - 10.8 x10E3/uL  Final  . RBC 09/25/2017 4.58  3.77 - 5.28 x10E6/uL Final  . Hemoglobin 09/25/2017 13.4  11.1 - 15.9 g/dL Final  . Hematocrit 09/81/1914 41.7  34.0 - 46.6 % Final  . MCV 09/25/2017 91  79 - 97 fL Final  . MCH 09/25/2017 29.3  26.6 - 33.0 pg Final  . MCHC 09/25/2017 32.1  31.5 - 35.7 g/dL Final  . RDW 78/29/5621 13.8  12.3 - 15.4 % Final  . Platelets 09/25/2017 258  150 - 450 x10E3/uL Final  . Neutrophils 09/25/2017 70  Not Estab. % Final  . Lymphs 09/25/2017 24  Not Estab. % Final  . Monocytes 09/25/2017 3  Not Estab. % Final  . Eos 09/25/2017 1  Not Estab. % Final  . Basos 09/25/2017 1  Not Estab. % Final  . Neutrophils Absolute 09/25/2017 6.0  1.4 - 7.0 x10E3/uL Final  . Lymphocytes Absolute 09/25/2017 2.0  0.7 - 3.1 x10E3/uL Final  . Monocytes Absolute 09/25/2017 0.3  0.1 - 0.9 x10E3/uL Final  . EOS (ABSOLUTE) 09/25/2017 0.1  0.0 - 0.4 x10E3/uL Final  . Basophils Absolute 09/25/2017 0.0  0.0 - 0.2 x10E3/uL Final  . Immature Granulocytes 09/25/2017 1  Not Estab. % Final  . Immature Grans (Abs) 09/25/2017 0.1  0.0 - 0.1 x10E3/uL Final  . Adequacy 09/25/2017 Satisfactory for evaluation  endocervical/transformation zone component ABSENT.   Final  . Diagnosis 09/25/2017 NEGATIVE FOR INTRAEPITHELIAL LESIONS OR MALIGNANCY.   Final  . Material Submitted 09/25/2017 CervicoVaginal Pap [ThinPrep Imaged]   Final  . Hgb A1c MFr Bld 09/25/2017 8.2* 4.8 - 5.6 % Final   Comment:          Prediabetes: 5.7 - 6.4          Diabetes: >6.4          Glycemic control for adults with diabetes: <7.0   . Est. average glucose Bld gHb Est-m* 09/25/2017 189  mg/dL Final  . Hemoglobin F Quantitation 09/25/2017 0.0  0.0 - 2.0 % Final  . Hgb A 09/25/2017 98.3  96.4 - 98.8 % Final  . HGB S 09/25/2017 0.0  0.0 % Final  . HGB C 09/25/2017 0.0  0.0 % Final  . Hemoglobin A2 Quantitation 09/25/2017 1.7* 1.8 - 3.2 % Final  . HGB VARIANT 09/25/2017 0.0  0.0 % Final  . HGB INTERPRETATION 09/25/2017 Comment   Final    Comment: Normal adult hemoglobin present. Low Hgb A2 may indicate an alpha-thalassemia or iron deficiency. Suggest clinical and hematologic correlation.   . Chlamydia 09/25/2017 Negative   Final   Normal Reference Range - Negative  . Neisseria gonorrhea 09/25/2017 Negative   Final   Normal Reference Range - Negative  . Trichomonas 09/25/2017 Negative   Final   Normal Reference Range - Negative  . Vit D, 25-Hydroxy 09/25/2017 15.3* 30.0 - 100.0 ng/mL Final   Comment: Vitamin D deficiency has been defined by the Institute of Medicine and an Endocrine Society practice guideline as a level of serum 25-OH vitamin D less than 20 ng/mL (1,2). The Endocrine Society went on to further define vitamin D insufficiency as a level between 21 and 29 ng/mL (2). 1. IOM (Institute of Medicine).  2010. Dietary reference    intakes for calcium and D. [place]: The    [employer]. 2. Holick MF, Binkley [place], Bischoff-Ferrari HA, et al.    Evaluation, treatment, and prevention of vitamin D    deficiency: an Endocrine Society clinical practice    guideline. JCEM. 2011 Jul; 96(7):1911-30.   Marland Kitchen Genetic Counselor 09/25/2017 Not applicable   Final  . Specimen Type 09/25/2017 Comment   Final   Peripheral Blood  . Ethnicity 09/25/2017 Comment   Final   Not Provided  . Indictation: 09/25/2017 Comment   Final   Not Provided  . Results: 09/25/2017 Note   Final   Comment: Disease (Gene)            Results           Interpretation Cystic fibrosis (CFTR)    Negative for the  These results                           mutations         reduce, but do                           analyzed          not eliminate,                                             the chance to be                                             a carrier. For                                             risk reductions                                             see Information                                             Table. Spinal  muscular atrophy   Negative          SMN1 copy number (SMN1)                                      of three (or                                             more). This  result reduces                                             but does not                                             eliminate the                                             risk to be a                                             carrier o                          f SMA. Fragile X syndrome        PCR: 30 and 31    Negative: not a (FMR1)                    repeats           carrier of a                                             fragile X                                             expansion                                             mutation. This                                             result is not                                             associated with                                             fragile X                                             syndrome.   . General Comments 09/25/2017 Note  Final   Comment: Genetic counseling services are available. To access [facility] [employer] [facility] please call [phone]).   . Additional Clinical Info 09/25/2017 Note   Final   Comment: Cystic fibrosis: Cystic fibrosis (CF) is an autosomal recessive disease with variable severity and age of onset. Symptoms of classic CF include elevated sweat chloride levels, progressive lung disease, pancreatic insufficiency, and female infertility. Individuals with mild CF may have pancreatic sufficiency. CFTR-related disorders include pancreatitis, bronchiectasis, and isolated female infertility due to congenital absence of the vas deferens. Treatment is primarily dietary and supportive. Genotype-targeted therapies may be available for some individuals. In severely affected individuals, lung transplantation may be indicated.  Cecile Hearing, [id number]) Spinal muscular atrophy: Spinal muscular atrophy (SMA) is an autosomal recessive disease with variable age of onset and severity caused by mutations in the SMN1 gene. Individuals with one copy of SMN1 are predicted to be carriers of SMA; those with two or more copies have a reduced carrier risk. Approximately 94% of affe                          cted individuals have 0 copies of SMN1. In individuals with 0 copies of SMN1 an increase in the number of copies of the SMN2 gene correlates with reduced disease severity [facility], [id number]). Clinical features of SMA include poor muscle tone, muscle weakness, absence of tendon reflexes, and delayed motor development. In severely affected individuals, abnormal fetal ultrasound findings  include congenital joint contractures, polyhydramnios, and decreased fetal movement (Korinthenberg, [id number]). Treatment is supportive. Fragile X syndrome: Fragile X syndrome is caused by an expansion of CGG repeat sequences in the FMR1 gene in 99% of cases. There are rare FMR1 mutations including missense mutations and gene deletions which cause fragile X syndrome. The interpretation is based on the following ranges of repeat sequences: Negative: < 45 repeats, Intermediate: 45-54 repeats, Premutation: 55-200 repeats with normal methylation pattern, Full Mutation: >200 re                          peats with abnormal methylation pattern. Reported CGG repeat sizes may vary as follows: +/- one for repeats less than 60, and +/- two to four for repeats in the 60 - 120 range respectively. For repeats greater than 120, the accuracy is +/- 10%.   . Method/Limitations 09/25/2017 Note   Final   Comment: Cystic fibrosis: CFTR gene regions are amplified enzymatically. 97 targeted CF mutations, listed below, are tested by multiplex allele-specific primer extension, bead array hybridization, and fluorescence detection. The test  discriminates between p.F508del and three polymorphisms (p.I506V, p.I507V and p.F508C). Numbering and nomenclature follow Human Genome Variation Society recommendations. The DNA reference sequence is [id number]. Legacy mutation names are available at [email or web address]. c.[id number] (p.S32fs), c.178G>T (p.E60*), c.223C>T (p.R75*), c.254G>A (p.G85E), c.262_263delTT (p.L81fs), c.273+1G>A, c.273+3A>C, c.274-1G>A, c.274G>T (p.E92*), c.313delA (p.I185fs), c.325_327delTATinsG (p.Y123fs), c.349C>T (p.R117C), c.350G>A (p.R117H), c.366T>A (p.Y122*), c.442delA (p.I149fs), c.489+1G>T, c.531delT (p.I138fs), c.532G>A (p.G178R), c.579+1G>T, c.579+5G>A, c.580-1G>T, c.617T>G (p.L206W), c.803delA (p.N212fs), c.805_806delAT (p.I244fs)                          , c.935_937delTCT (p.F312del), c.948delT (p.F370fs), c.988G>T (p.G330*), c.1000C>T (p.R334W), c.1013C>T (p.T338I), c.1040G>A  (p.R347H), c.1040G>C (p.R347P), c.1055G>A (p.R352Q), c.[1075C>A;1079C>A] (p.[Q359K;T360K]), c.1090T>C (p.S364P), c.1155_1156dupTA, c.1364C>A (p.A455E), c.1438G>T (p.G480C), c.1477C>T (p.Q493*), c.1519_1521delATC (p.I507del), c.1521_1523delCTT (p.F508del), W.7371_0626RSWNI (p.Y515*), c.1558G>T (p.V520F), c.1572C>A (p.C524*), c.1585-1G>A, c.1624G>T (p.G542*), c.1646G>A (p.S549N), c.1647T>G (p.S549R), c.1652G>A (p.G551D), c.1654C>T (p.Q552*), c.1657C>T (p.R553*),  c.1675G>A (p.A559T), c.1679G>C (p.R560T), c.1680-1G>A, c.1721C>A (p.P574H), c.1766+1G>A, c.1766+5G>T, c.1820_1903del84 (p.M607_Q634del), c.1911delG (p.Q661fs), c.1923_1931del9insA (p.S665fs), c.1973_1985del13insAGAAA (p.R665fs), c.1976delA (p.N63fs), c.2012delT, c.2051_2052delAAinsG (p.K650fs), c.2052delA (p.K664fs), c.2052dupA (p.Q68fs), c.2125C>T (p.R709*), c.2128A>T (p.K710*), c.217                          5dupA (p.E744fs), c.2290C>T  (p.R764*), c.2657+5G>A, c.2668C>T  (p.Q890*), c.2737_2738insG (p.Y913*), c.2988G>A,  c.2988+1G>A, c.3039delC (p.Y1066fs), c.3067_3072delATAGTG (p.I1023_V1024del), c.3196C>T (p.R1066C), c.3266G>A (p.W1089*), c.3276C>A (p.Y1092*), c.3276C>G (p.Y1092*), c.3302T>A (p.M1101K), c.3454G>C (p.D1152H), c.3472C>T (p.R1158*), c.3484C>T (p.R1162*), c.3528delC (p.K1132fs), c.3536_3539delCCAA (p.T1141fs), c.3587C>G (p.S1196*), c.3612G>A (p.W1204*), c.3659delC (p.T1237fs), c.3712C>T (p.Q1238*), c.3717+12191C>T, c.3744delA (p.K1219fs), c.3752G>A (p.S1251N), c.3764C>A (p.S1255*), c.3773dupT (p.L1279fs), c.3846G>A (p.W1282*), c.3889dupT, c.3909C>G (p.N1303K). Spinal muscular atrophy: Isolated DNA is amplified by real-time polymerase chain reaction (PCR). The number of copies of exon 7 of  SMN1 is assessed relative to internal standard reference genes. A mathematical algorithm calculates 0, 1, 2 and 3 copies with statistical confidence. In samples with one co                          py of SMN1, primer and probe binding sites are sequenced to rule out variants that could interfere with copy number analysis. In samples with 0 copies of SMN1, SMN2 copy number is assessed by digital PCR analysis relative to an internal standard reference gene. Copy number analysis cannot detect carriers with either 2 or, very rarely, 3 copies of SMN1 on one chromosome and no copies of SMN1 on the other chromosome.        Fragile X syndrome: Isolated DNA is amplified by the polymerase chain reaction (PCR) to determine the size of the CGG repeat within the FMR1 gene. PCR products are generated using a fluorescence labeled primer and sized by capillary gel electrophoresis. If indicated, Southern blot analysis is performed by hybridizing the probe StB12.3 to EcoRI- and EagI-digested DNA. The analytical sensitivity of both Southern blot and PCR analyses is 99% for expansion mutations in the FMR1 gene. Reported CGG repeat sizes may vary as follows: +/- one for repeats less                           than 60,  and +/- two to four for repeats in the 60 - 120 range respectively. For repeats greater than 120, the accuracy is +/- 10%. Limitations: False positive or false negative results may occur for reasons that include genetic variants, blood transfusions, bone marrow transplantation, somatic or tissue-specific mosaicism, mislabeled samples, or erroneous representation of family relationships.   . Information Table 09/25/2017 Note   Final   Comment: CF risk reductions for individuals with no family history Ethnicity    Detection  Pre-test      Post test carrier risk              Rate       carrier risk  with negative result African      81%        1 in 61       1 in 316 American Ashkenazi    97%        1 in 24       1 in 767 Jewish Asian        55%        1 in 94       1 in 208 American Caucasian    93%  1 in 25       1 in 343 Hispanic     78%        1 in 58       1 in 260 Jewish,      Varies     n/a           n/a non-Ashken- azi Mixed or        For counseling purposes, consider using the other           ethnic background with the most ethnic          conservative risk estimates background. SMA risk reductions for individuals with no family history Ethnicity    Detection  Pre-test  Post test     Post test              Rate       carrier   carrier risk  carrier risk                         risk      with 2 copy   with 3 copy                                   result        negative                                                                           result African      70.5%      1 in 72   1 in 130      1 in 4,200 American Ashkenazi    90.5%      1 in 67   1 in 611      1 in 5,400 Jewish Asian        93.3%      1 in 59   1 in 806      1 in 5,600 Asian        90.2%      1 in 52   1 in 443      1 in 5,400 [place] Caucasian    94.8%      1 in 47   1 in 834      1 in 5,600 Hispanic     90.0%      1 in 68   1 in 579      1 in 5,400 Mixed or        For counseling purposes,  consider using the other           ethnic background with the most ethnic          conservative risk estimates background.   . Disclaimer: 09/25/2017 Note   Final   Comment: This test was developed and its performance characteristics determined by [facility].  It has not been cleared or approved by the Food and Drug Administration. [facility] is a business unit of [facility] [employer], a wholly-owned subsidiary of [employer]. [employer] is a Publishing rights managerregistered service mark of Laboratory  [employer]poration of [employer]. Testing performed at [facility], [place], [address], [place], [place] [address] Merdis Delay A. Allitto, PhD, [facility], Laboratory Director [phone]   . Director Review 09/25/2017 Comment   Final   [employer]  . Glucose 09/25/2017 229* 65 - 99 mg/dL Final  . BUN 14/78/2956 7  6 - 20 mg/dL Final  . Creatinine, Ser 09/25/2017 0.79  0.57 - 1.00 mg/dL Final  . GFR calc non Af Amer 09/25/2017 107  >59 mL/min/1.73 Final  . GFR calc Af Amer 09/25/2017 123  >59 mL/min/1.73 Final  . BUN/Creatinine Ratio 09/25/2017 9  9 - 23 Final  . Sodium 09/25/2017 137  134 - 144 mmol/L Final  . Potassium 09/25/2017 4.0  3.5 - 5.2 mmol/L Final  . Chloride 09/25/2017 102  96 - 106 mmol/L Final  . CO2 09/25/2017 21  20 - 29 mmol/L Final  . Calcium 09/25/2017 9.4  8.7 - 10.2 mg/dL Final  . Total Protein 09/25/2017 7.0  6.0 - 8.5 g/dL Final  . Albumin 21/30/8657 4.6  3.5 - 5.5 g/dL Final  . Globulin, Total 09/25/2017 2.4  1.5 - 4.5 g/dL Final  . Albumin/Globulin Ratio 09/25/2017 1.9  1.2 - 2.2 Final  . Bilirubin Total 09/25/2017 0.3  0.0 - 1.2 mg/dL Final  . Alkaline Phosphatase 09/25/2017 61  39 - 117 IU/L Final  . AST 09/25/2017 10  0 - 40 IU/L Final  . ALT 09/25/2017 9  0 - 32 IU/L Final  . Creatinine, Urine 09/25/2017 117.7  Not Estab. mg/dL Final  . Protein, Ur 84/69/6295 25.9  Not Estab.  mg/dL Final  . Protein/Creat Ratio 09/25/2017 220* 0 - 200 mg/g creat Final  . TSH Pregnancy 09/25/2017 1.490  0.450 - 4.500 uIU/mL Final   Comment:                              Reference Interval                    PREGNANCY:                    First Trimester        0.100 - 4.000                    Second Trimester       0.200 - 4.000                    Third Trimester        0.300 - 4.000                    Non-Pregnant Adult     0.450 - 4.500   . Organism ID, Bacteria 09/25/2017 Escherichia coli   Final   Comment: Greater than 100,000 colony forming units per mL Cefazolin <=4 ug/mL Cefazolin with an MIC <=16 predicts susceptibility to the oral agents cefaclor, cefdinir, cefpodoxime, cefprozil, cefuroxime, cephalexin, and loracarbef when used for therapy of uncomplicated urinary tract infections due to E. coli, Klebsiella pneumoniae, and Proteus mirabilis.   Marland Kitchen Antimicrobial Susceptibility 09/25/2017 Comment   Final   Comment:       ** S = Susceptible; I = Intermediate; R = Resistant **                    P = Positive; N = Negative  MICS are expressed in micrograms per mL    Antibiotic                 RSLT#1    RSLT#2    RSLT#3    RSLT#4 Amoxicillin/Clavulanic Acid    S =4 Ampicillin                     R =R Ceftriaxone                    S<=0.25 Cefuroxime                     R =32 Ciprofloxacin                  S<=0.25 Ertapenem                      S<=0.12 Gentamicin                     S<=1 Imipenem                       S<=0.25 Levofloxacin                   S<=0.12 Meropenem                      S<=0.25 Nitrofurantoin                 R =128 Tetracycline                   S<=1 Tobramycin                     S<=1 Trimethoprim/Sulfa             S<=20     Physical Examination:  BP 130/80 (BP Location: Left Arm, Patient Position: Sitting, Cuff Size: Normal)   Pulse 98   Ht 5\' 3"  (1.6 m)   Wt 227 lb 3.2 oz (103.1 kg)   LMP 06/16/2017   SpO2 98%   BMI  40.25 kg/m       ASSESSMENT:  Diabetes type 2, recently diagnosed with baseline A1c 13.4  See history of present illness for detailed discussion of current diabetes management, blood sugar patterns and problems identified    PLAN:    She will need to check her blood sugar every morning and also at least 2 hours after her main meals  She needs much more diabetes education and follow-up with nutritionist also  Currently she is not having side effects from metformin and will go up to at least 3 tablets daily with adding another one at dinnertime  Showed her on the insulin pen how to dial up the doses and go up to 8 units of BASAGLAR for now  Given her flowsheet with detailed instructions on how to adjust this every 3 days by 2 units to get morning sugars at least under 100 but target to be 90 or less  Discussed postprandial blood sugar target of 140 or less  For now she can continue glyburide  Discussed need for possibly starting mealtime insulin if postprandial readings are higher  Meantime she does need to eat breakfast in the morning daily at least with some protein  Also needs to add a bedtime snack regularly  She will follow-up in 2 weeks with nurse educator for reinforcement of day-to-day  management, review of sugar patterns and further adjustments as needed  Fructosamine to be checked on the next visit  Patient Instructions  Metformin 1 at lunch and 2 at dinner    insulin: This insulin provides blood sugar control for up to 24 hours.   Start with 8 units at bedtime daily and increase by 2 units every 3 days until the waking up sugars are under 100. Then continue the same dose.  Note that this insulin does not control the rise of blood sugar with meals    Check blood sugars on waking up  daily  Also check blood sugars about 2 hours after a meal and do this after different meals by rotation  Recommended blood sugar levels on waking up is 90-130 and about 2 hours  after meal is 130-160  Please bring your blood sugar monitor to each visit, thank you  Bedtime snack and am also   Counseling time on subjects discussed in assessment and plan sections is over 50% of today's 25 minute visit    Reather Littler 10/10/2017, 12:44 PM   Note: This office note was prepared with Dragon voice recognition system technology. Any transcriptional errors that result from this process are unintentional.

## 2017-10-10 NOTE — Patient Instructions (Addendum)
Metformin 1 at lunch and 2 at dinner    insulin: This insulin provides blood sugar control for up to 24 hours.   Start with 8 units at bedtime daily and increase by 2 units every 3 days until the waking up sugars are under 100. Then continue the same dose.  Note that this insulin does not control the rise of blood sugar with meals    Check blood sugars on waking up  daily  Also check blood sugars about 2 hours after a meal and do this after different meals by rotation  Recommended blood sugar levels on waking up is 90-130 and about 2 hours after meal is 130-160  Please bring your blood sugar monitor to each visit, thank you  Bedtime snack and am also

## 2017-10-16 ENCOUNTER — Other Ambulatory Visit: Payer: 59

## 2017-10-16 ENCOUNTER — Ambulatory Visit (INDEPENDENT_AMBULATORY_CARE_PROVIDER_SITE_OTHER): Payer: 59 | Admitting: Obstetrics and Gynecology

## 2017-10-16 ENCOUNTER — Encounter: Payer: Self-pay | Admitting: Obstetrics and Gynecology

## 2017-10-16 VITALS — BP 137/83 | HR 96 | Wt 229.0 lb

## 2017-10-16 DIAGNOSIS — O26899 Other specified pregnancy related conditions, unspecified trimester: Secondary | ICD-10-CM

## 2017-10-16 DIAGNOSIS — E1165 Type 2 diabetes mellitus with hyperglycemia: Secondary | ICD-10-CM

## 2017-10-16 DIAGNOSIS — Z6791 Unspecified blood type, Rh negative: Secondary | ICD-10-CM

## 2017-10-16 DIAGNOSIS — O0990 Supervision of high risk pregnancy, unspecified, unspecified trimester: Secondary | ICD-10-CM

## 2017-10-16 DIAGNOSIS — O99210 Obesity complicating pregnancy, unspecified trimester: Secondary | ICD-10-CM

## 2017-10-16 MED ORDER — ASPIRIN EC 81 MG PO TBEC: 81.0000 mg | DELAYED_RELEASE_TABLET | Freq: Every day | ORAL | 2 refills | Status: DC

## 2017-10-16 NOTE — Progress Notes (Signed)
   PRENATAL VISIT NOTE  Subjective:  Angela Barry is a 22 y.o. G2P0010 at 2311w3d being seen today for ongoing prenatal care.  She is currently monitored for the following issues for this high-risk pregnancy and has Uncontrolled type 2 diabetes mellitus with hyperglycemia, without long-term current use of insulin (HCC); Supervision of high risk pregnancy, antepartum; Obesity during pregnancy with antepartum complication; Rh negative state in antepartum period; and Vitamin D deficiency on their problem list.  Patient reports no complaints.  Contractions: Not present. Vag. Bleeding: None.   . Denies leaking of fluid.   The following portions of the patient's history were reviewed and updated as appropriate: allergies, current medications, past family history, past medical history, past social history, past surgical history and problem list. Problem list updated.  Objective:   Vitals:   10/16/17 1032  BP: 137/83  Pulse: 96  Weight: 229 lb (103.9 kg)    Fetal Status: Fetal Heart Rate (bpm): 160         General:  Alert, oriented and cooperative. Patient is in no acute distress.  Skin: Skin is warm and dry. No rash noted.   Cardiovascular: Normal heart rate noted  Respiratory: Normal respiratory effort, no problems with respiration noted  Abdomen: Soft, gravid, appropriate for gestational age.  Pain/Pressure: Absent     Pelvic: Cervical exam deferred        Extremities: Normal range of motion.     Mental Status: Normal mood and affect. Normal behavior. Normal judgment and thought content.   Assessment and Plan:  Pregnancy: G2P0010 at 4811w3d  1. Supervision of high risk pregnancy, antepartum Anatomy scheduled for 10/27/17 Fetal echo ordered Reviewed need for optho, patient will make appointment  2. Uncontrolled type 2 diabetes mellitus with hyperglycemia, without long-term current use of insulin (HCC) Managed by endo Dr. Lucianne MussKumar Metformin 2000 mg QHS with dinner (taking  1500) Glyburide 2.5 mg with dinner Insulin glargine 40 units QHS, she has been increasing it by 2 units every 3 days with elevated sugar and still increasing Does not have log today, encouraged her to bring it with her FG: generally "lower 100s, below 150" PP: generally 150s-160s Cont increasing insulin per dr. Lucianne Musskumar Not yet on baby ASA, to start today, reviewed   3. Rh negative state in antepartum period  4. Obesity during pregnancy with antepartum complication   Preterm labor symptoms and general obstetric precautions including but not limited to vaginal bleeding, contractions, leaking of fluid and fetal movement were reviewed in detail with the patient. Please refer to After Visit Summary for other counseling recommendations.  Return in about 3 weeks (around 11/06/2017) for OB visit (MD).  Future Appointments  Date Time Provider Department Center  10/22/2017  9:00 AM Jessica PriestSpagnola, Linda D, RN [facility]  10/27/2017 11:00 AM [facility] US 3 [facility]  11/06/2017 11:15 AM Constant, Gigi GinPeggy, MD [facility] None  11/12/2017  9:45 AM Reather LittlerKumar, Ajay, MD [facility] None    Conan BowensKelly M Davis, MD

## 2017-10-19 ENCOUNTER — Other Ambulatory Visit: Payer: Self-pay | Admitting: Endocrinology

## 2017-10-20 ENCOUNTER — Encounter (HOSPITAL_COMMUNITY): Payer: Self-pay

## 2017-10-20 ENCOUNTER — Encounter: Payer: Self-pay | Admitting: General Practice

## 2017-10-20 ENCOUNTER — Ambulatory Visit: Payer: 59 | Admitting: Endocrinology

## 2017-10-22 ENCOUNTER — Encounter: Payer: 59 | Attending: Nurse Practitioner | Admitting: Nutrition

## 2017-10-22 DIAGNOSIS — Z713 Dietary counseling and surveillance: Secondary | ICD-10-CM | POA: Diagnosis present

## 2017-10-22 DIAGNOSIS — E1165 Type 2 diabetes mellitus with hyperglycemia: Secondary | ICD-10-CM

## 2017-10-22 DIAGNOSIS — E119 Type 2 diabetes mellitus without complications: Secondary | ICD-10-CM | POA: Insufficient documentation

## 2017-10-27 ENCOUNTER — Other Ambulatory Visit (HOSPITAL_COMMUNITY): Payer: Self-pay | Admitting: Obstetrics and Gynecology

## 2017-10-27 ENCOUNTER — Other Ambulatory Visit (HOSPITAL_COMMUNITY): Payer: Self-pay | Admitting: *Deleted

## 2017-10-27 ENCOUNTER — Other Ambulatory Visit: Payer: Self-pay | Admitting: Certified Nurse Midwife

## 2017-10-27 ENCOUNTER — Ambulatory Visit (HOSPITAL_COMMUNITY)
Admission: RE | Admit: 2017-10-27 | Discharge: 2017-10-27 | Disposition: A | Discharge status: Home / Self Care | Payer: 59 | Source: Ambulatory Visit | Attending: Certified Nurse Midwife | Admitting: Certified Nurse Midwife

## 2017-10-27 ENCOUNTER — Other Ambulatory Visit: Payer: Self-pay | Admitting: Obstetrics & Gynecology

## 2017-10-27 ENCOUNTER — Encounter (HOSPITAL_COMMUNITY): Payer: Self-pay

## 2017-10-27 DIAGNOSIS — Z3A17 17 weeks gestation of pregnancy: Secondary | ICD-10-CM | POA: Diagnosis not present

## 2017-10-27 DIAGNOSIS — O24112 Pre-existing diabetes mellitus, type 2, in pregnancy, second trimester: Secondary | ICD-10-CM | POA: Diagnosis not present

## 2017-10-27 DIAGNOSIS — O99212 Obesity complicating pregnancy, second trimester: Secondary | ICD-10-CM

## 2017-10-27 DIAGNOSIS — Z363 Encounter for antenatal screening for malformations: Secondary | ICD-10-CM

## 2017-10-27 DIAGNOSIS — O0990 Supervision of high risk pregnancy, unspecified, unspecified trimester: Secondary | ICD-10-CM

## 2017-10-27 DIAGNOSIS — E119 Type 2 diabetes mellitus without complications: Secondary | ICD-10-CM

## 2017-10-27 DIAGNOSIS — E1165 Type 2 diabetes mellitus with hyperglycemia: Secondary | ICD-10-CM

## 2017-10-31 NOTE — Progress Notes (Signed)
Patient reports no difficulty giving self injections of Basaglar.  She is now taking 10u.  She did not bring her meter, but says blood sugars are "all good".    Could not remember what reading was this morning, but says all are less than 120.  Discussed the goals of readings--fastings less than 90, and 2hr. pc readings less than 140.  She reported good understanding of this.  Discussed the need to get the FBSs less than 100, and the need to increase the dose of Basaglar per Dr. Remus BlakeKumar's instructions of 1-2u q 3 days.  She reported good understanding of this.   We also discussed her meals/  She is eating balanced meals and reducing her "sweets".  She denies drinking sweet drinks.  Discussed other drink options of flavored water.  She had no final questions

## 2017-11-05 ENCOUNTER — Telehealth: Payer: Self-pay | Admitting: Nutrition

## 2017-11-05 NOTE — Telephone Encounter (Signed)
Message left on machine to call me with blood sugar readings.  Telephone number given.

## 2017-11-06 ENCOUNTER — Encounter: Payer: Self-pay | Admitting: Obstetrics and Gynecology

## 2017-11-06 ENCOUNTER — Ambulatory Visit (INDEPENDENT_AMBULATORY_CARE_PROVIDER_SITE_OTHER): Payer: 59 | Admitting: Obstetrics and Gynecology

## 2017-11-06 VITALS — BP 133/83 | HR 92 | Wt 229.0 lb

## 2017-11-06 DIAGNOSIS — O26899 Other specified pregnancy related conditions, unspecified trimester: Secondary | ICD-10-CM

## 2017-11-06 DIAGNOSIS — Z6791 Unspecified blood type, Rh negative: Secondary | ICD-10-CM

## 2017-11-06 DIAGNOSIS — O0990 Supervision of high risk pregnancy, unspecified, unspecified trimester: Secondary | ICD-10-CM

## 2017-11-06 DIAGNOSIS — E1165 Type 2 diabetes mellitus with hyperglycemia: Secondary | ICD-10-CM

## 2017-11-06 NOTE — Progress Notes (Signed)
ROB w/ no complaints.  Pt has log of sugars.

## 2017-11-06 NOTE — Progress Notes (Signed)
   PRENATAL VISIT NOTE  Subjective:  Angela Barry is a 22 y.o. G2P0010 at 9038w5d being seen today for ongoing prenatal care.  She is currently monitored for the following issues for this high-risk pregnancy and has Uncontrolled type 2 diabetes mellitus with hyperglycemia, without long-term current use of insulin (HCC); Supervision of high risk pregnancy, antepartum; Obesity during pregnancy with antepartum complication; Rh negative state in antepartum period; and Vitamin D deficiency on their problem list.  Patient reports no complaints.  Contractions: Not present. Vag. Bleeding: None.  Movement: Absent. Denies leaking of fluid.   The following portions of the patient's history were reviewed and updated as appropriate: allergies, current medications, past family history, past medical history, past social history, past surgical history and problem list. Problem list updated.  Objective:   Vitals:   11/06/17 1105  BP: 133/83  Pulse: 92  Weight: 229 lb (103.9 kg)    Fetal Status: Fetal Heart Rate (bpm): 154   Movement: Absent     General:  Alert, oriented and cooperative. Patient is in no acute distress.  Skin: Skin is warm and dry. No rash noted.   Cardiovascular: Normal heart rate noted  Respiratory: Normal respiratory effort, no problems with respiration noted  Abdomen: Soft, gravid, appropriate for gestational age.  Pain/Pressure: Absent     Pelvic: Cervical exam deferred        Extremities: Normal range of motion.  Edema: None  Mental Status: Normal mood and affect. Normal behavior. Normal judgment and thought content.   Assessment and Plan:  Pregnancy: G2P0010 at 1838w5d  1. Supervision of high risk pregnancy, antepartum Patient is doing well without complaints  2. Uncontrolled type 2 diabetes mellitus with hyperglycemia, without long-term current use of insulin (HCC) CBGs reviewed and pp as high as 139 with most in the low 120's and fasting all within range Continue  glycemic regimen per endocrinologist which she sees next week  Follow up growth ultrasound as scheduled Patient scheduled for fetal echo  3. Rh negative state in antepartum period Rhogam at 28 weeks  Preterm labor symptoms and general obstetric precautions including but not limited to vaginal bleeding, contractions, leaking of fluid and fetal movement were reviewed in detail with the patient. Please refer to After Visit Summary for other counseling recommendations.  No follow-ups on file.  Future Appointments  Date Time Provider Department Center  11/12/2017  9:45 AM Reather LittlerKumar, Ajay, MD [facility] None  11/24/2017  3:00 PM [facility] US 3 [facility]    Catalina AntiguaPeggy Othel Dicostanzo, MD

## 2017-11-10 ENCOUNTER — Other Ambulatory Visit: Payer: Self-pay | Admitting: Endocrinology

## 2017-11-11 ENCOUNTER — Other Ambulatory Visit: Payer: Self-pay | Admitting: Endocrinology

## 2017-11-12 ENCOUNTER — Ambulatory Visit (INDEPENDENT_AMBULATORY_CARE_PROVIDER_SITE_OTHER): Payer: 59 | Admitting: Endocrinology

## 2017-11-12 ENCOUNTER — Encounter: Payer: Self-pay | Admitting: Endocrinology

## 2017-11-12 VITALS — BP 106/62 | HR 100 | Ht 63.0 in | Wt 232.0 lb

## 2017-11-12 DIAGNOSIS — E1165 Type 2 diabetes mellitus with hyperglycemia: Secondary | ICD-10-CM | POA: Diagnosis not present

## 2017-11-12 LAB — GLUCOSE, RANDOM: GLUCOSE: 194 mg/dL — AB (ref 70–99)

## 2017-11-12 MED ORDER — FREESTYLE LIBRE 14 DAY SENSOR MISC: 1.0000 [IU] | 4 refills | Status: DC

## 2017-11-12 MED ORDER — FREESTYLE LIBRE 14 DAY READER DEVI: 1.0000 | Freq: Once | 0 refills | Status: AC

## 2017-11-12 MED ORDER — INSULIN ASPART 100 UNIT/ML FLEXPEN: 10.0000 [IU] | PEN_INJECTOR | Freq: Three times a day (TID) | SUBCUTANEOUS | 11 refills | Status: DC

## 2017-11-12 NOTE — Progress Notes (Signed)
Patient ID: Angela Barry, female   DOB: 03/31/1996, 22 y.o.   MRN: [id number]          Reason for Appointment: for Type 2 Diabetes  Referring PCP: Sharion DoveYolanda Johnson FNP   History of Present Illness:          Date of diagnosis of type 2 diabetes mellitus:   06/2017      Background history:   She [facility] and because of her family history of diabetes she had screening labs done which showed her blood sugar to be 310 and A1c of 15.4 She had some symptoms of increased thirst but not unusual fatigue, weight loss, blurred vision or excessive urination She was started on metformin and referred here  Recent history:    Non-insulin hypoglycemic drugs the patient is taking are: Metformin ER 500 mg twice daily, glyburide 2.5 mg at dinner INSULIN: Basaglar 20 units at bedtime  Current management, blood sugar patterns and problems identified:  Patient  is now [redacted] weeks pregnant  She was started on Basaglar insulin in late June  On her last visit she was having still mildly increased blood sugars fasting and she was told to go up to 8 units on the insulin and progressively increase it until her morning sugars were below at least 100  However she has checked her blood sugars only infrequently on waking up and has not changed her insulin dose for the last week  She appears to have mildly increased blood sugars throughout the day averaging about 130-140 at any given time  Postprandial readings recently after lunch have been as high as 224 but not checking consistently  She has seen the dietitian and is trying to make some changes in her diet  Currently not doing any walking for exercise More recently has checked her sugars only about once a day despite instructions on checking readings before and after each meal       Side effects from medications have been: Diarrhea from metformin  Compliance with the medical regimen: Poor   Glucose monitoring:   done 1-4 times a day         Glucometer:  Accu-Chek       Blood Glucose readings from download:    PRE-MEAL Fasting Lunch Dinner Bedtime Overall  Glucose range:  113-146  106-151  131-234  115-207   Mean/median:  130  131  141  162 141    Self-care: The diet that the pasient has been following is: Lower fat    Meal times QMV:HQIOare:Bfst  9 am Lunch 11am-1 pm  dinner: 6 PM   Typical meal intake: Breakfast is yogurt               Dietician visit, most recent: 6/19               Exercise:  Some walking, mostly going to work, 30 min about 2 or 3 times a week, not briskly  Weight history: Previous range 210-240  Wt Readings from Last 3 Encounters:  11/12/17 232 lb (105.2 kg)  11/06/17 229 lb (103.9 kg)  10/27/17 228 lb 12.8 oz (103.8 kg)    Glycemic control:     Lab Results  Component Value Date   HGBA1C 8.2 (H) 09/25/2017   HGBA1C 13.4 07/10/2017   Lab Results  Component Value Date   CREATININE 0.79 09/25/2017   No results found for: [facility]  Lab Results  Component Value Date  FRUCTOSAMINE 273 09/05/2017      Allergies as of 11/12/2017   No Known Allergies     Medication List        Accurate as of 11/12/17  9:54 AM. Always use your most recent med list.          aspirin EC 81 MG tablet Take 1 tablet (81 mg total) by mouth daily. Take after 12 weeks for prevention of preeclampsia later in pregnancy   BASAGLAR KWIKPEN 100 UNIT/ML Sopn INJECT 0.1 MLS (10 UNITS TOTAL) INTO THE SKIN AT BEDTIME.   glucose blood test strip Use as instructed to check blood sugar 3 times daily.   glucose blood test strip 1 each by Other route as needed for other. Use as instructed to check blood sugar 4 times daily   glyBURIDE 2.5 MG tablet Commonly known as:  DIABETA TAKE 1 TABLET (2.5 MG TOTAL) BY MOUTH DAILY WITH SUPPER.   Lancets Misc Use to check blood sugar as instructed.   metFORMIN 500 MG 24 hr tablet Commonly known as:  GLUCOPHAGE-XR Take 4 tablets (2,000 mg  total) by mouth daily with supper.   OB COMPLETE PETITE 35-5-1-200 MG Caps Take 1 tablet by mouth daily.   PNV PO Take 1 tablet by mouth.   sertraline 50 MG tablet Commonly known as:  ZOLOFT Take 50 mg by mouth daily.   Vitamin D (Ergocalciferol) 50000 units Caps capsule Commonly known as:  DRISDOL Take 1 capsule (50,000 Units total) by mouth every 7 (seven) days.       Allergies: No Known Allergies  Past Medical History:  Diagnosis Date  . Diabetes mellitus without complication [facility](HCC)     Past Surgical History:  Procedure Laterality Date  . NO PAST SURGERIES      Family History  Problem Relation Age of Onset  . Diabetes Father   . Diabetes Paternal Grandmother   . Diabetes Paternal Grandfather   . Hypertension Neg Hx     Social History:  reports that she has never smoked. She has never used smokeless tobacco. She reports that she drank alcohol. She reports that she does not use drugs.   Review of Systems   Lipid history: No levels available   No results found for: CHOL, HDL, LDLCALC, LDLDIRECT, TRIG, CHOLHDL         Hypertension: No history  BP Readings from Last 3 Encounters:  11/12/17 106/62  11/06/17 133/83  10/27/17 120/71     Most recent foot exam: 6/19    LABS:    Physical Examination:  BP 106/62   Pulse 100   Ht 5\' 3"  (1.6 m)   Wt 232 lb (105.2 kg)   LMP 06/16/2017   SpO2 97%   BMI 41.10 kg/m       ASSESSMENT:  Diabetes type 2, diagnosed with baseline A1c 13.4  See history of present illness for detailed discussion of current diabetes management, blood sugar patterns and problems identified  Her blood sugars are still inadequately controlled despite adding basal insulin in addition to her glyburide and metformin Since she has been to the dietitian she is trying to improve her diet but her blood sugars are not optimal at any given time She is also not motivated to check her blood sugars much despite reminders Discussed that  she is having postprandial hyperglycemia also not controlled with current regimen   PLAN:    She will need to check her blood sugar consistently every morning and also at least once  or twice a day about 1-2 hours after eating  We will try to get her the freestyle libre sensor to make it easier to check her sugars and discussed how this would be used, not clear this would be covered by her insurance  Today discussed in detail the need for mealtime insulin to cover postprandial spikes, action of mealtime insulin, use of the insulin pen, timing and action of the rapid acting insulin as well as starting dose and dosage titration to target the two-hour reading of under 140  She will start with 3 to 5 units of mealtime NovoLog and go up 1 to 2 units until her postprandial readings are consistently below 140  She likely needs more basal insulin because of fasting readings appear to be mostly over 120 recently  Given her flowsheet with detailed instructions on how to adjust this every 3 days by 2 units to get morning sugars at least under 100 but target to be 60-90; for now she will go up to 26 units Basaglar  Needs to have balanced meals with some protein and not to skip meals  She will follow-up in 2 weeks with nurse educator for review of her progress and make sure she is on target with her management and insulin  A1c on the next visit  Fructosamine to be checked  There are no Patient Instructions on file for this visit.  Counseling time on subjects discussed in assessment and plan sections is over 50% of today's 25 minute visit    Reather Littler 11/12/2017, 9:54 AM   Note: This office note was prepared with Dragon voice recognition system technology. Any transcriptional errors that result from this process are unintentional.

## 2017-11-12 NOTE — Patient Instructions (Addendum)
NovoLog will be right before eating, 3 units before breakfast, 5 minutes before lunch and dinner  Basaglar insulin: This insulin provides blood sugar control for up to 24 hours.    Start with 26 units at bedtime daily and increase by 2 units every 3 days until the waking up sugars are under 90 Then continue the same dose.  Note that this insulin does not control the rise of blood sugar with meals    Check blood sugars on waking up  daily  Also check blood sugars about 2 hours after a meal and do this after different meals by rotation  Recommended blood sugar levels on waking up is 90-130 and about 2 hours after meal is 130-160  Please bring your blood sugar monitor to each visit, thank you  Stop glyburide and continue metformin  Regular walking 30 minutes daily

## 2017-11-13 LAB — FRUCTOSAMINE: Fructosamine: 263 umol/L (ref 0–285)

## 2017-11-21 ENCOUNTER — Other Ambulatory Visit: Payer: Self-pay | Admitting: Endocrinology

## 2017-11-24 ENCOUNTER — Ambulatory Visit (HOSPITAL_COMMUNITY)
Admission: RE | Admit: 2017-11-24 | Discharge: 2017-11-24 | Disposition: A | Discharge status: Home / Self Care | Payer: 59 | Source: Ambulatory Visit | Attending: Obstetrics | Admitting: Obstetrics

## 2017-11-24 ENCOUNTER — Encounter (HOSPITAL_COMMUNITY): Payer: Self-pay

## 2017-11-24 ENCOUNTER — Other Ambulatory Visit (HOSPITAL_COMMUNITY): Payer: Self-pay | Admitting: *Deleted

## 2017-11-24 DIAGNOSIS — E119 Type 2 diabetes mellitus without complications: Secondary | ICD-10-CM | POA: Diagnosis present

## 2017-11-24 DIAGNOSIS — O24112 Pre-existing diabetes mellitus, type 2, in pregnancy, second trimester: Secondary | ICD-10-CM | POA: Diagnosis not present

## 2017-11-24 DIAGNOSIS — Z3A21 21 weeks gestation of pregnancy: Secondary | ICD-10-CM | POA: Insufficient documentation

## 2017-11-24 DIAGNOSIS — O99212 Obesity complicating pregnancy, second trimester: Secondary | ICD-10-CM | POA: Diagnosis not present

## 2017-11-25 ENCOUNTER — Telehealth: Payer: Self-pay | Admitting: Advanced Practice Midwife

## 2017-11-25 ENCOUNTER — Ambulatory Visit (INDEPENDENT_AMBULATORY_CARE_PROVIDER_SITE_OTHER): Payer: 59 | Admitting: Advanced Practice Midwife

## 2017-11-25 ENCOUNTER — Encounter: Payer: 59 | Admitting: Nutrition

## 2017-11-25 ENCOUNTER — Other Ambulatory Visit: Payer: Self-pay

## 2017-11-25 VITALS — BP 132/80 | HR 109 | Wt 235.8 lb

## 2017-11-25 DIAGNOSIS — Z23 Encounter for immunization: Secondary | ICD-10-CM

## 2017-11-25 DIAGNOSIS — E1165 Type 2 diabetes mellitus with hyperglycemia: Secondary | ICD-10-CM

## 2017-11-25 DIAGNOSIS — O0990 Supervision of high risk pregnancy, unspecified, unspecified trimester: Secondary | ICD-10-CM

## 2017-11-25 MED ORDER — INSULIN ASPART 100 UNIT/ML ~~LOC~~ SOLN: 10.0000 [IU] | Freq: Three times a day (TID) | SUBCUTANEOUS | 12 refills | Status: DC

## 2017-11-25 MED ORDER — "INSULIN SYRINGE 30G X 1/2"" 1 ML MISC": 1.0000 | Freq: Three times a day (TID) | 2 refills | Status: DC

## 2017-11-25 NOTE — Progress Notes (Signed)
ROB.  FLU given in right arm, tolerated well.

## 2017-11-25 NOTE — Progress Notes (Signed)
   PRENATAL VISIT NOTE  Subjective:  Angela Barry is a 22 y.o. G2P0010 at 3273w3d being seen today for ongoing prenatal care.  She is currently monitored for the following issues for this high-risk pregnancy and has Uncontrolled type 2 diabetes mellitus with hyperglycemia, without long-term current use of insulin (HCC); Supervision of high risk pregnancy, antepartum; Obesity during pregnancy with antepartum complication; Rh negative state in antepartum period; and Vitamin D deficiency on their problem list.  Patient reports no complaints.  Contractions: Not present.  .  Movement: Present. Denies leaking of fluid.   The following portions of the patient's history were reviewed and updated as appropriate: allergies, current medications, past family history, past medical history, past social history, past surgical history and problem list. Problem list updated.  Objective:   Vitals:   11/25/17 1528  BP: 132/80  Pulse: (!) 109  Weight: 107 kg    Fetal Status: Fetal Heart Rate (bpm): 153   Movement: Present     General:  Alert, oriented and cooperative. Patient is in no acute distress.  Skin: Skin is warm and dry. No rash noted.   Cardiovascular: Normal heart rate noted  Respiratory: Normal respiratory effort, no problems with respiration noted  Abdomen: Soft, gravid, appropriate for gestational age.  Pain/Pressure: Absent     Pelvic: Cervical exam deferred        Extremities: Normal range of motion.  Edema: None  Mental Status: Normal mood and affect. Normal behavior. Normal judgment and thought content.   Assessment and Plan:  Pregnancy: G2P0010 at 1173w3d  1. Supervision of high risk pregnancy, antepartum --Anticipatory guidance about next visits/weeks of pregnancy given. - Flu Vaccine QUAD 36+ mos IM (Fluarix, Quad PF)  2. Uncontrolled type 2 diabetes mellitus with hyperglycemia, without long-term current use of insulin (HCC) --Pt does not have glucose log but reports fastings  are 109, low 110s and PP are all 150s up to 200s. --She is taking her long acting insulin Q HS but has not started her with meals Novolog due to cost. She is prescribed insulin pen from endocrinology and reports the cost at $75 with her insurance.  She does have concerns about giving herself insulin but reports she has been able to do it with the long acting insulin pen. --Consult Dr Shawnie PonsPratt.  To lower costs for pt, change insulin to formulary Novolog or humalog covered by pt insurance and to insulin with vial and syringe instead of more expensive pen.   - insulin aspart (NOVOLOG) 100 UNIT/ML injection; Inject 10 Units into the skin 3 (three) times daily with meals.  Dispense: 10 mL; Refill: 12 - Insulin Syringe-Needle U-100 (INSULIN SYRINGE 1CC/30GX1/2") 30G X 1/2" 1 ML MISC; 1 Device by Does not apply route 3 (three) times daily with meals.  Dispense: 100 each; Refill: 2 --F/U in 1 week to review glucose log.  Preterm labor symptoms and general obstetric precautions including but not limited to vaginal bleeding, contractions, leaking of fluid and fetal movement were reviewed in detail with the patient. Please refer to After Visit Summary for other counseling recommendations.  No follow-ups on file.  Future Appointments  Date Time Provider Department Center  12/19/2017  8:45 AM [facility] None  12/22/2017  9:00 AM Reather LittlerKumar, Ajay, MD [facility] None  12/22/2017  4:00 PM [facility] US 3 [facility]    Sharen CounterLisa Leftwich-Kirby, CNM

## 2017-11-25 NOTE — Telephone Encounter (Signed)
Pt unable to pick up Novolog insulin pen due to cost. Rx changed to Novolog vials with syringes and sent to pt pharmacy. Called to notify pt but busy signal received.  Office to call pt again tomorrow to discuss change in regimen.  Pt to continue long acting insulin every night as prescribed.

## 2017-11-26 ENCOUNTER — Telehealth: Payer: Self-pay

## 2017-11-26 NOTE — Telephone Encounter (Signed)
-----   Message from Hurshel PartyLisa A Leftwich-Kirby, CNM sent at 11/25/2017  5:41 PM EDT ----- Regarding: Change in insulin order I saw this G1 @ 21 weeks on 8/27 for prenatal visit. She is not taking her Novolog insulin due to cost.  Endocrinology prescribed insulin pens to make it easy for her but they are costly. I changed her Rx to insulin vials with syringes but I called and her phone is busy. Please call her again to notify her of the change.  If she needs instruction on how to draw up insulin and administer it, she can come to our office for a nurse visit or see if her endocrinology office will see her. She needs to start the medication ASAP.  Thank you.

## 2017-11-26 NOTE — Telephone Encounter (Signed)
TC to pt to make aware pt not ava left detailed message for pt to contact the office.

## 2017-12-08 ENCOUNTER — Telehealth: Payer: Self-pay

## 2017-12-08 NOTE — Telephone Encounter (Signed)
Attempted to call pt. No answer. Left message on vm for pt to return call to office

## 2017-12-08 NOTE — Patient Instructions (Signed)
Test before and 2 hours after meals.   Increase the Basaglar by 1-2 units every 2-3 days until FBSs are less than 90

## 2017-12-08 NOTE — Telephone Encounter (Signed)
-----   Message from Hurshel Party, CNM sent at 11/25/2017  5:41 PM EDT ----- Regarding: Change in insulin order I saw this G1 @ 21 weeks on 8/27 for prenatal visit. She is not taking her Novolog insulin due to cost.  Endocrinology prescribed insulin pens to make it easy for her but they are costly. I changed her Rx to insulin vials with syringes but I called and her phone is busy. Please call her again to notify her of the change.  If she needs instruction on how to draw up insulin and administer it, she can come to our office for a nurse visit or see if her endocrinology office will see her. She needs to start the medication ASAP.  Thank you.

## 2017-12-11 ENCOUNTER — Ambulatory Visit (INDEPENDENT_AMBULATORY_CARE_PROVIDER_SITE_OTHER): Payer: 59 | Admitting: Obstetrics and Gynecology

## 2017-12-11 ENCOUNTER — Encounter: Payer: Self-pay | Admitting: Obstetrics

## 2017-12-11 ENCOUNTER — Encounter: Payer: Self-pay | Admitting: Obstetrics and Gynecology

## 2017-12-11 VITALS — BP 125/81 | HR 111 | Wt 236.0 lb

## 2017-12-11 DIAGNOSIS — O0990 Supervision of high risk pregnancy, unspecified, unspecified trimester: Secondary | ICD-10-CM

## 2017-12-11 DIAGNOSIS — E1165 Type 2 diabetes mellitus with hyperglycemia: Secondary | ICD-10-CM

## 2017-12-11 DIAGNOSIS — Z6791 Unspecified blood type, Rh negative: Secondary | ICD-10-CM

## 2017-12-11 DIAGNOSIS — O26899 Other specified pregnancy related conditions, unspecified trimester: Secondary | ICD-10-CM

## 2017-12-11 MED ORDER — SERTRALINE HCL 50 MG PO TABS: 50.0000 mg | ORAL_TABLET | Freq: Every day | ORAL | 3 refills | Status: DC

## 2017-12-11 NOTE — Progress Notes (Signed)
Patient reports good fetal movement, denies pain. 

## 2017-12-11 NOTE — Progress Notes (Signed)
   PRENATAL VISIT NOTE  Subjective:  Angela Barry is a 22 y.o. G2P0010 at 5562w5d being seen today for ongoing prenatal care.  She is currently monitored for the following issues for this high-risk pregnancy and has Uncontrolled type 2 diabetes mellitus with hyperglycemia, without long-term current use of insulin (HCC); Supervision of high risk pregnancy, antepartum; Obesity during pregnancy with antepartum complication; Rh negative state in antepartum period; and Vitamin D deficiency on their problem list.  Patient reports no complaints.  Contractions: Not present. Vag. Bleeding: None.  Movement: Present. Denies leaking of fluid.   The following portions of the patient's history were reviewed and updated as appropriate: allergies, current medications, past family history, past medical history, past social history, past surgical history and problem list. Problem list updated.  Objective:   Vitals:   12/11/17 1421  BP: 125/81  Pulse: (!) 111  Weight: 236 lb (107 kg)    Fetal Status: Fetal Heart Rate (bpm): 155(Simultaneous filing. User may not have seen previous data.) Fundal Height: 24 cm Movement: Present     General:  Alert, oriented and cooperative. Patient is in no acute distress.  Skin: Skin is warm and dry. No rash noted.   Cardiovascular: Normal heart rate noted  Respiratory: Normal respiratory effort, no problems with respiration noted  Abdomen: Soft, gravid, appropriate for gestational age.  Pain/Pressure: Absent     Pelvic: Cervical exam deferred        Extremities: Normal range of motion.  Edema: None  Mental Status: Normal mood and affect. Normal behavior. Normal judgment and thought content.   Assessment and Plan:  Pregnancy: G2P0010 at 5062w5d  1. Supervision of high risk pregnancy, antepartum Patient is doing well   2. Uncontrolled type 2 diabetes mellitus with hyperglycemia, without long-term current use of insulin (HCC) Patient did not bring CBG log or meter. She  reports fasting as high as 200 and pp 150-200 She did not pick up new Novolog prescription as she was not aware- Patient advised to pick up prescription and start Reviewed importance of euglycemia in pregnancy Patient scheduled to see endocrinologist on 9/23 Growth ultrasound already scheduled  3. Rh negative state in antepartum period Rhogam at 28 weeks  Preterm labor symptoms and general obstetric precautions including but not limited to vaginal bleeding, contractions, leaking of fluid and fetal movement were reviewed in detail with the patient. Please refer to After Visit Summary for other counseling recommendations.  Return in about 3 weeks (around 01/01/2018) for ROB.  Future Appointments  Date Time Provider Department Center  12/19/2017  8:45 AM [facility] None  12/22/2017  9:00 AM Reather LittlerKumar, Ajay, MD [facility] None  12/22/2017  4:00 PM [facility] US 3 [facility]    Catalina AntiguaPeggy Holland Nickson, MD

## 2017-12-19 ENCOUNTER — Other Ambulatory Visit (INDEPENDENT_AMBULATORY_CARE_PROVIDER_SITE_OTHER): Payer: 59

## 2017-12-19 DIAGNOSIS — E1165 Type 2 diabetes mellitus with hyperglycemia: Secondary | ICD-10-CM

## 2017-12-19 LAB — LIPID PANEL
CHOLESTEROL: 95 mg/dL (ref 0–200)
HDL: 36.6 mg/dL — ABNORMAL LOW (ref >39.00)
LDL Cholesterol: 27 mg/dL (ref 0–99)
NONHDL: 58.13
Total CHOL/HDL Ratio: 3
Triglycerides: 157 mg/dL — ABNORMAL HIGH (ref 0.0–149.0)
VLDL: 31.4 mg/dL (ref 0.0–40.0)

## 2017-12-19 LAB — HEMOGLOBIN A1C: HEMOGLOBIN A1C: 7.8 % — AB (ref 4.6–6.5)

## 2017-12-19 LAB — GLUCOSE, RANDOM: GLUCOSE: 112 mg/dL — AB (ref 70–99)

## 2017-12-22 ENCOUNTER — Ambulatory Visit: Payer: 59 | Admitting: Endocrinology

## 2017-12-22 ENCOUNTER — Encounter (HOSPITAL_COMMUNITY): Payer: Self-pay

## 2017-12-22 ENCOUNTER — Other Ambulatory Visit: Payer: Self-pay | Admitting: Endocrinology

## 2017-12-22 ENCOUNTER — Ambulatory Visit (HOSPITAL_COMMUNITY)
Admission: RE | Admit: 2017-12-22 | Discharge: 2017-12-22 | Disposition: A | Discharge status: Home / Self Care | Payer: 59 | Source: Ambulatory Visit | Attending: Obstetrics | Admitting: Obstetrics

## 2017-12-22 DIAGNOSIS — Z3A25 25 weeks gestation of pregnancy: Secondary | ICD-10-CM | POA: Insufficient documentation

## 2017-12-22 DIAGNOSIS — E1165 Type 2 diabetes mellitus with hyperglycemia: Secondary | ICD-10-CM

## 2017-12-22 DIAGNOSIS — E119 Type 2 diabetes mellitus without complications: Secondary | ICD-10-CM

## 2017-12-22 DIAGNOSIS — Z362 Encounter for other antenatal screening follow-up: Secondary | ICD-10-CM | POA: Diagnosis not present

## 2017-12-22 DIAGNOSIS — O99212 Obesity complicating pregnancy, second trimester: Secondary | ICD-10-CM | POA: Diagnosis not present

## 2017-12-22 DIAGNOSIS — Z794 Long term (current) use of insulin: Secondary | ICD-10-CM | POA: Insufficient documentation

## 2017-12-22 DIAGNOSIS — O24112 Pre-existing diabetes mellitus, type 2, in pregnancy, second trimester: Secondary | ICD-10-CM | POA: Insufficient documentation

## 2017-12-22 DIAGNOSIS — E669 Obesity, unspecified: Secondary | ICD-10-CM | POA: Insufficient documentation

## 2017-12-22 MED ORDER — INSULIN ASPART 100 UNIT/ML ~~LOC~~ SOLN: 10.0000 [IU] | Freq: Three times a day (TID) | SUBCUTANEOUS | 12 refills | Status: DC

## 2017-12-22 NOTE — Telephone Encounter (Signed)
Pt stated need refill on her Rx Novolog, not the pen due to not able to afford it. Rx Novolog bile to [facility]

## 2017-12-23 ENCOUNTER — Other Ambulatory Visit (HOSPITAL_COMMUNITY): Payer: Self-pay | Admitting: *Deleted

## 2017-12-23 DIAGNOSIS — IMO0001 Reserved for inherently not codable concepts without codable children: Secondary | ICD-10-CM

## 2017-12-23 DIAGNOSIS — O24313 Unspecified pre-existing diabetes mellitus in pregnancy, third trimester: Principal | ICD-10-CM

## 2017-12-23 DIAGNOSIS — Z794 Long term (current) use of insulin: Principal | ICD-10-CM

## 2017-12-30 ENCOUNTER — Encounter: Payer: 59 | Attending: Nurse Practitioner | Admitting: Nutrition

## 2017-12-30 ENCOUNTER — Other Ambulatory Visit: Payer: Self-pay

## 2017-12-30 DIAGNOSIS — E119 Type 2 diabetes mellitus without complications: Secondary | ICD-10-CM | POA: Diagnosis not present

## 2017-12-30 DIAGNOSIS — Z713 Dietary counseling and surveillance: Secondary | ICD-10-CM | POA: Insufficient documentation

## 2017-12-30 DIAGNOSIS — E1165 Type 2 diabetes mellitus with hyperglycemia: Secondary | ICD-10-CM

## 2017-12-30 MED ORDER — "INSULIN SYRINGE-NEEDLE U-100 29G X 1/2"" 0.3 ML MISC": 0 refills | Status: DC

## 2017-12-30 MED ORDER — INSULIN PEN NEEDLE 31G X 4 MM MISC: 1.0000 | Freq: Three times a day (TID) | 3 refills | Status: DC

## 2017-12-30 NOTE — Patient Instructions (Signed)
Increase basalglar to 16u Twice daily.  Increase this dose every 3 days by 2units until your morning blood sugars are less than 90 Increase novolog to 7u before meals. See Dr Lucianne Muss in 1 week.

## 2017-12-30 NOTE — Progress Notes (Signed)
Pt. Did not bring meter.  Says FBSs today was 157,and has been running like this for the last week.  Also in the then 160s acS.   Taking basaglar 28u acB and 5u Novolog ac meals.  Per Dr. Remus Blake order, the dose of basaglar was increased to 16u BID and 7u of Novolog ac meals.  Written directions were given to increase dose of basaglar by 2u q 3 days until FBSs are less than 90,  She reported good understanding of this with no final questions

## 2018-01-01 ENCOUNTER — Ambulatory Visit (INDEPENDENT_AMBULATORY_CARE_PROVIDER_SITE_OTHER): Payer: 59 | Admitting: Obstetrics and Gynecology

## 2018-01-01 ENCOUNTER — Encounter: Payer: Self-pay | Admitting: Obstetrics and Gynecology

## 2018-01-01 VITALS — BP 128/83 | HR 99 | Wt 239.4 lb

## 2018-01-01 DIAGNOSIS — Z23 Encounter for immunization: Secondary | ICD-10-CM

## 2018-01-01 DIAGNOSIS — O360920 Maternal care for other rhesus isoimmunization, second trimester, not applicable or unspecified: Secondary | ICD-10-CM

## 2018-01-01 DIAGNOSIS — Z6791 Unspecified blood type, Rh negative: Secondary | ICD-10-CM

## 2018-01-01 DIAGNOSIS — O26899 Other specified pregnancy related conditions, unspecified trimester: Secondary | ICD-10-CM

## 2018-01-01 DIAGNOSIS — E1165 Type 2 diabetes mellitus with hyperglycemia: Secondary | ICD-10-CM

## 2018-01-01 DIAGNOSIS — O0992 Supervision of high risk pregnancy, unspecified, second trimester: Secondary | ICD-10-CM

## 2018-01-01 DIAGNOSIS — O0990 Supervision of high risk pregnancy, unspecified, unspecified trimester: Secondary | ICD-10-CM

## 2018-01-01 MED ORDER — RHO D IMMUNE GLOBULIN 1500 UNIT/2ML IJ SOSY
300.0000 ug | PREFILLED_SYRINGE | Freq: Once | INTRAMUSCULAR | Status: AC
  Administered 2018-01-01: 300 ug via INTRAMUSCULAR

## 2018-01-01 NOTE — Progress Notes (Signed)
   PRENATAL VISIT NOTE  Subjective:  Angela Barry is a 22 y.o. G2P0010 at [redacted]w[redacted]d being seen today for ongoing prenatal care.  She is currently monitored for the following issues for this high-risk pregnancy and has Uncontrolled type 2 diabetes mellitus with hyperglycemia, without long-term current use of insulin (HCC); Supervision of high risk pregnancy, antepartum; Obesity during pregnancy with antepartum complication; Rh negative state in antepartum period; and Vitamin D deficiency on their problem list.  Patient reports no complaints.  Contractions: Not present. Vag. Bleeding: None.  Movement: Present. Denies leaking of fluid.   The following portions of the patient's history were reviewed and updated as appropriate: allergies, current medications, past family history, past medical history, past social history, past surgical history and problem list. Problem list updated.  Objective:   Vitals:   01/01/18 1010  BP: 128/83  Pulse: 99  Weight: 239 lb 6.4 oz (108.6 kg)    Fetal Status: Fetal Heart Rate (bpm): 150   Movement: Present     General:  Alert, oriented and cooperative. Patient is in no acute distress.  Skin: Skin is warm and dry. No rash noted.   Cardiovascular: Normal heart rate noted  Respiratory: Normal respiratory effort, no problems with respiration noted  Abdomen: Soft, gravid, appropriate for gestational age.  Pain/Pressure: Absent     Pelvic: Cervical exam deferred        Extremities: Normal range of motion.  Edema: None  Mental Status: Normal mood and affect. Normal behavior. Normal judgment and thought content.   Assessment and Plan:  Pregnancy: G2P0010 at [redacted]w[redacted]d  1. Supervision of high risk pregnancy, antepartum - CBC - RPR - HIV Antibody (routine testing w rflx)  2. Uncontrolled type 2 diabetes mellitus with hyperglycemia, without long-term current use of insulin (HCC) next growth 01/19/18 Cont baby ASA Regimen set by [facility] endocrinology, she saw them  earlier this week, now on: Insulin glargine 10 units BID, novolog 7 units TID, Metformin 2000 units with dinner FG: states are getting lower, this am 140 and yesterday 138 PP: 200, sometimes lower Reports endocrine added the novolog and BID insulin 3 days ago, and told her to increase nightly glargine by 2 units every few days if still high, she feels comfortable adjusting herself, discussed with patient that FG still remains high and encouraged her to increase, she will see them again in two weeks Encouraged her to bring log, has not been checking sugars regularly but reports she is now  3. Rh negative state in antepartum period Rho gam today  Preterm labor symptoms and general obstetric precautions including but not limited to vaginal bleeding, contractions, leaking of fluid and fetal movement were reviewed in detail with the patient. Please refer to After Visit Summary for other counseling recommendations.  Return in about 2 weeks (around 01/15/2018) for OB visit (MD).  Future Appointments  Date Time Provider Department Center  01/13/2018  1:40 PM Shamleffer, Landry Mellow, MD [facility] None  01/19/2018  2:00 PM [facility] [place] 3 [facility]    Conan Bowens, MD

## 2018-01-01 NOTE — Progress Notes (Signed)
Fasting CBG reading this morning was 140 per pt. She did not bring CBG log in today.

## 2018-01-02 LAB — CBC
Hematocrit: 39.1 % (ref 34.0–46.6)
Hemoglobin: 12.7 g/dL (ref 11.1–15.9)
MCH: 29.7 pg (ref 26.6–33.0)
MCHC: 32.5 g/dL (ref 31.5–35.7)
MCV: 92 fL (ref 79–97)
Platelets: 203 10*3/uL (ref 150–450)
RBC: 4.27 x10E6/uL (ref 3.77–5.28)
RDW: 13.8 % (ref 12.3–15.4)
WBC: 5.8 10*3/uL (ref 3.4–10.8)

## 2018-01-02 LAB — HIV ANTIBODY (ROUTINE TESTING W REFLEX): HIV SCREEN 4TH GENERATION: NONREACTIVE

## 2018-01-02 LAB — RPR: RPR Ser Ql: NONREACTIVE

## 2018-01-13 ENCOUNTER — Other Ambulatory Visit: Payer: Self-pay | Admitting: Endocrinology

## 2018-01-13 ENCOUNTER — Encounter: Payer: Self-pay | Admitting: Internal Medicine

## 2018-01-13 ENCOUNTER — Telehealth: Payer: Self-pay | Admitting: Internal Medicine

## 2018-01-13 ENCOUNTER — Ambulatory Visit (INDEPENDENT_AMBULATORY_CARE_PROVIDER_SITE_OTHER): Payer: 59 | Admitting: Internal Medicine

## 2018-01-13 VITALS — BP 110/70 | HR 113 | Ht 62.0 in | Wt 236.0 lb

## 2018-01-13 DIAGNOSIS — E1165 Type 2 diabetes mellitus with hyperglycemia: Secondary | ICD-10-CM | POA: Diagnosis not present

## 2018-01-13 LAB — GLUCOSE, POCT (MANUAL RESULT ENTRY): POC Glucose: 231 mg/dl — AB (ref 70–99)

## 2018-01-13 MED ORDER — INSULIN ASPART 100 UNIT/ML ~~LOC~~ SOLN: 16.0000 [IU] | Freq: Three times a day (TID) | SUBCUTANEOUS | 12 refills | Status: DC

## 2018-01-13 MED ORDER — "INSULIN SYRINGE-NEEDLE U-100 29G X 1/2"" 0.3 ML MISC": 12 refills | Status: DC

## 2018-01-13 MED ORDER — BASAGLAR KWIKPEN 100 UNIT/ML ~~LOC~~ SOPN: 28.0000 [IU] | PEN_INJECTOR | Freq: Every day | SUBCUTANEOUS | 1 refills | Status: DC

## 2018-01-13 NOTE — Patient Instructions (Addendum)
TIMING  BLOOD SUGAR GOALS  FASTING (before breakfast)  60 - 95  BEFORE MEALS LESS THAN 100  2 hours AFTER MEAL LESS THAN 120    - Continue Metformin at current dose - INcrease Basaglar to 28 units at Bedtime - INcrease Novolog to 16 units  Before each meal, three times a day. - Avoid all sugar- sweetened beverages - Avoid snacking between meals, but if absolutely have to eat something choose low carbohydrate snacks as below Choose healthy, lower carb lower calorie snacks: toss salad, cooked vegetables, cottage cheese, peanut butter, low fat cheese / string cheese, lower sodium deli meat, tuna salad or chicken salad

## 2018-01-13 NOTE — Progress Notes (Signed)
Patient ID: Angela Barry, female   DOB: 1995-11-28, 22 y.o.   MRN: [id number]           Reason for Appointment: for Type 2 Diabetes  Referring PCP: Sharion Dove FNP   History of Present Illness:          Date of diagnosis of type 2 diabetes mellitus:   06/2017      Background history:   She had [facility] and because of her family history of diabetes she had screening labs done which showed her blood sugar to be 310 and A1c of 15.4. She did have polyuria and polydipsia at the time. She was started on Metformin at the time and referred to our clinic.  She was started on metformin and referred here  Recent history:  She is currently 28 weeks of gestation. She is G1P0. She has been started on insulin late June, 2019. Today she has no complaints. She continues to check glucose twice a day . 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   Her eating habits are erratic. She rarely drinks sugar-sweetened beverages. She tends to eat 2 meals a day and at times she snacks.  She admits compliance with basaglar but tends to forget her bolus insulin once a day.    Diabetes Medications: Metformin ER 500 mg Two Tablets twice daily Basaglar 24 units at bedtime Novolog 14 units TID QAC Glyburide: NOT TAKING                  METER DOWNLOAD SUMMARY: Date range 9/16-10/15/19 Fingerstick Blood Glucose Tests = 27 Average Number Tests/Day = 0.9 Overall Mean FS Glucose = 179 Standard Deviation = 43  BG Ranges: Low = 119 High = 282   Hypoglycemic Events/30 Days: BG < 50 = 0 Episodes of symptomatic severe hypoglycemia = 0                Dietician visit, most recent: 6/19                Glycemic control:     Lab Results  Component Value Date   HGBA1C 7.8 (H) 12/19/2017   HGBA1C 8.2 (H) 09/25/2017   HGBA1C 13.4 07/10/2017   Lab Results  Component Value Date   LDLCALC 27 12/19/2017   CREATININE  0.79 09/25/2017   No results found for: MICRALBCREAT  Lab Results  Component Value Date   FRUCTOSAMINE 263 11/12/2017   FRUCTOSAMINE 273 09/05/2017      Allergies as of 01/13/2018   No Known Allergies     Medication List        Accurate as of 01/13/18  1:53 PM. Always use your most recent med list.          aspirin EC 81 MG tablet Take 1 tablet (81 mg total) by mouth daily. Take after 12 weeks for prevention of preeclampsia later in pregnancy   BASAGLAR KWIKPEN 100 UNIT/ML Sopn INJECT 0.1 MLS (10 UNITS TOTAL) INTO THE SKIN AT BEDTIME.   glucose blood test strip Use as instructed to check blood sugar 3 times daily.   glucose blood test strip 1 each by Other route as needed for other. Use as instructed to check blood sugar 4 times daily   glyBURIDE 2.5 MG tablet Commonly known as:  DIABETA TAKE 1 TABLET (2.5 MG TOTAL) BY MOUTH DAILY WITH SUPPER.   insulin aspart 100  UNIT/ML injection Commonly known as:  novoLOG Inject 10 Units into the skin 3 (three) times daily with meals.   Insulin Pen Needle 31G X 4 MM Misc 1 Package by Does not apply route 3 (three) times daily before meals.   Insulin Syringe-Needle U-100 29G X 1/2" 0.3 ML Misc Use  To inject into skin 3 times daily before meals   Lancets Misc Use to check blood sugar as instructed.   metFORMIN 500 MG 24 hr tablet Commonly known as:  GLUCOPHAGE-XR TAKE 4 TABLETS (2,000 MG TOTAL) BY MOUTH DAILY WITH SUPPER.   PNV PO Take 1 tablet by mouth.   sertraline 50 MG tablet Commonly known as:  ZOLOFT Take 1 tablet (50 mg total) by mouth daily.   Vitamin D (Ergocalciferol) 50000 units Caps capsule Commonly known as:  DRISDOL Take 1 capsule (50,000 Units total) by mouth every 7 (seven) days.       Allergies: No Known Allergies  Past Medical History:  Diagnosis Date  . Diabetes mellitus without complication [facility])     Past Surgical History:  Procedure Laterality Date  . NO PAST SURGERIES       Family History  Problem Relation Age of Onset  . Diabetes Father   . Diabetes Paternal Grandmother   . Diabetes Paternal Grandfather   . Hypertension Neg Hx     Social History:  reports that she has never smoked. She has never used smokeless tobacco. She reports that she drank alcohol. She reports that she does not use drugs.   Review of Systems   Most recent foot exam: 6/19     Physical Examination:  BP 110/70 (BP Location: Left Arm, Patient Position: Sitting, Cuff Size: Normal)   Pulse (!) 113   Ht 5\' 2"  (1.575 m)   Wt 107 kg   LMP 06/16/2017   SpO2 97%   BMI 43.16 kg/m  PHYSICAL EXAM: VS: BP 110/70 (BP Location: Left Arm, Patient Position: Sitting, Cuff Size: Normal)   Pulse (!) 113   Ht 5\' 2"  (1.575 m)   Wt 107 kg   LMP 06/16/2017   SpO2 97%   BMI 43.16 kg/m    EXAM: General: Pt appears well and is in NAD  Hydration: Well-hydrated with with moist mucous membranes and good skin turgor  Eyes: External eye exam normal without stare, lid lag or exophthalmos.  EOM intact.  PERRL.  Ears, Nose, Throat: Hearing: grossly intact bilaterally Dental: Good dentition  Throat: Clear without mass, erythema or exudate  Neck: General: Supple without adenopathy. Thyroid: Thyroid size normal.  No goiter or nodules appreciated. No thyroid bruit.  Lungs: Clear with good BS bilat with no rales, rhonchi, or wheezes  Heart: Auscultation: RRR with normal S1 and S2, no gallops or murmurs Carotid arteries: no bruits Periph. circulation: no peripheral edema  Abdomen: Normoactive bowel sounds, gravid uterus.  Extremities: Gait and station: normal gait  BL LE: no pretibial edema normal ROM and strength, no joint enlargement or tenderness  Skin: Hair: texture and amount normal with gender appropriate distribution Skin Inspection: no rashes, acanthosis nigricans/skin tags. No lipohypertrophy. Skin Palpation: skin temperature, texture, and thickness normal to palpation  Neuro:  Cranial nerves: II - XII grossly intact ;  Motor: normal strength throughout DTRs: 2+ and symmetric in UE without delay in relaxation phase  Mental Status: Judgment, insight: intact Orientation: oriented to time, place, and person Mood and affect: no depression, anxiety, or agitation        ASSESSMENT/PLAN:  Type 2 DM, poorly controlled during pregnancy , at ~ 28 weeks of gestation:   - Her last  A1c at ~ a month ago was 7.8% . This is due to dietary indiscretion and medication non-adherence.  - I explained to the patient that her BG's are above goal for pregnancy, which put her baby boy at risk for macrosomia and other complications  Such as congenital defects.  - Patient was provided with pregnancy goals of glucose.  - She was encourage to avoid snacking between meals and was provided with low carb snack options if necessary. - She was also advised to avoid sugar-sweetened beverages.    Recommendations:   - Increase Basaglar to 28 units QHS - Increase Novolog to 16 units  - Continue Metformin 2000 mg daily - Stay off Glyburide       Madelin Rear Ashutosh Dieguez 01/13/2018, 1:53 PM   Note: This office note was prepared with Insurance underwriter. Any transcriptional errors that result from this process are unintentional.

## 2018-01-13 NOTE — Telephone Encounter (Signed)
error 

## 2018-01-14 ENCOUNTER — Telehealth: Payer: Self-pay | Admitting: Internal Medicine

## 2018-01-14 NOTE — Telephone Encounter (Signed)
Schedule 2 month follow up with Dr. Lonzo Cloud.

## 2018-01-14 NOTE — Telephone Encounter (Signed)
Dr. Lonzo Cloud stated she would like to see her in 1 month now.

## 2018-01-15 ENCOUNTER — Ambulatory Visit (INDEPENDENT_AMBULATORY_CARE_PROVIDER_SITE_OTHER): Payer: 59 | Admitting: Obstetrics and Gynecology

## 2018-01-15 ENCOUNTER — Encounter: Payer: Self-pay | Admitting: Obstetrics and Gynecology

## 2018-01-15 VITALS — BP 123/81 | HR 94 | Wt 241.2 lb

## 2018-01-15 DIAGNOSIS — E1165 Type 2 diabetes mellitus with hyperglycemia: Secondary | ICD-10-CM

## 2018-01-15 DIAGNOSIS — Z6791 Unspecified blood type, Rh negative: Secondary | ICD-10-CM

## 2018-01-15 DIAGNOSIS — O26899 Other specified pregnancy related conditions, unspecified trimester: Secondary | ICD-10-CM

## 2018-01-15 DIAGNOSIS — O99210 Obesity complicating pregnancy, unspecified trimester: Secondary | ICD-10-CM

## 2018-01-15 DIAGNOSIS — O0990 Supervision of high risk pregnancy, unspecified, unspecified trimester: Secondary | ICD-10-CM

## 2018-01-15 LAB — POCT CBG (FASTING - GLUCOSE)-MANUAL ENTRY: Glucose Fasting, POC: 144 mg/dL — AB (ref 70–99)

## 2018-01-15 MED ORDER — INSULIN ASPART 100 UNIT/ML ~~LOC~~ SOLN: 18.0000 [IU] | Freq: Three times a day (TID) | SUBCUTANEOUS | 12 refills | Status: DC

## 2018-01-15 MED ORDER — BASAGLAR KWIKPEN 100 UNIT/ML ~~LOC~~ SOPN: 30.0000 [IU] | PEN_INJECTOR | Freq: Two times a day (BID) | SUBCUTANEOUS | 1 refills | Status: DC

## 2018-01-15 NOTE — Progress Notes (Addendum)
Patient reports good fetal movement, denies pain. Pt states that she has hard time remembering to take check her sugars, last checked on 01-12-18.

## 2018-01-15 NOTE — Addendum Note (Signed)
Addended by: Natale Milch D on: 01/15/2018 10:45 AM   Modules accepted: Orders

## 2018-01-15 NOTE — Progress Notes (Signed)
   PRENATAL VISIT NOTE  Subjective:  Angela Barry is a 22 y.o. G2P0010 at [redacted]w[redacted]d being seen today for ongoing prenatal care.  She is currently monitored for the following issues for this high-risk pregnancy and has Uncontrolled type 2 diabetes mellitus with hyperglycemia, without long-term current use of insulin (HCC); Supervision of high risk pregnancy, antepartum; Obesity during pregnancy with antepartum complication; Rh negative state in antepartum period; and Vitamin D deficiency on their problem list.  Patient reports no complaints.  Contractions: Not present. Vag. Bleeding: None.  Movement: Present. Denies leaking of fluid.   The following portions of the patient's history were reviewed and updated as appropriate: allergies, current medications, past family history, past medical history, past social history, past surgical history and problem list. Problem list updated.  Objective:   Vitals:   01/15/18 0958  BP: 123/81  Pulse: 94  Weight: 241 lb 3.2 oz (109.4 kg)    Fetal Status: Fetal Heart Rate (bpm): 148   Movement: Present     General:  Alert, oriented and cooperative. Patient is in no acute distress.  Skin: Skin is warm and dry. No rash noted.   Cardiovascular: Normal heart rate noted  Respiratory: Normal respiratory effort, no problems with respiration noted  Abdomen: Soft, gravid, appropriate for gestational age.  Pain/Pressure: Absent     Pelvic: Cervical exam deferred        Extremities: Normal range of motion.  Edema: None  Mental Status: Normal mood and affect. Normal behavior. Normal judgment and thought content.   Assessment and Plan:  Pregnancy: G2P0010 at [redacted]w[redacted]d  1. Supervision of high risk pregnancy, antepartum   2. Uncontrolled type 2 diabetes mellitus with hyperglycemia, without long-term current use of insulin (HCC) States she takes insulin regularly, on basaglar 26 units BID, novolog 16 units TID Does not have log, encouraged her to bring it Not  following diabetic diet well FG: reports all fasting glucose are 140s PP: low 200s Increase basaglar to 30 units and novolog to 18 units TID Follows with [facility] endocrinology Next growth 10/21  3. Rh negative state in antepartum period Rho gam give last visit  4. Obesity during pregnancy with antepartum complication   Preterm labor symptoms and general obstetric precautions including but not limited to vaginal bleeding, contractions, leaking of fluid and fetal movement were reviewed in detail with the patient. Please refer to After Visit Summary for other counseling recommendations.  Return in about 2 weeks (around 01/29/2018) for OB visit (MD).  Future Appointments  Date Time Provider Department Center  01/19/2018  2:00 PM [facility] [place] 3 [facility]    Conan Bowens, MD

## 2018-01-19 ENCOUNTER — Other Ambulatory Visit (HOSPITAL_COMMUNITY): Payer: Self-pay | Admitting: *Deleted

## 2018-01-19 ENCOUNTER — Ambulatory Visit (HOSPITAL_COMMUNITY)
Admission: RE | Admit: 2018-01-19 | Discharge: 2018-01-19 | Disposition: A | Discharge status: Home / Self Care | Payer: 59 | Source: Ambulatory Visit | Attending: Obstetrics | Admitting: Obstetrics

## 2018-01-19 ENCOUNTER — Encounter (HOSPITAL_COMMUNITY): Payer: Self-pay

## 2018-01-19 DIAGNOSIS — O24313 Unspecified pre-existing diabetes mellitus in pregnancy, third trimester: Secondary | ICD-10-CM

## 2018-01-19 DIAGNOSIS — Z3A29 29 weeks gestation of pregnancy: Secondary | ICD-10-CM | POA: Diagnosis not present

## 2018-01-19 DIAGNOSIS — Z794 Long term (current) use of insulin: Secondary | ICD-10-CM | POA: Insufficient documentation

## 2018-01-19 DIAGNOSIS — O99213 Obesity complicating pregnancy, third trimester: Secondary | ICD-10-CM

## 2018-01-19 DIAGNOSIS — E119 Type 2 diabetes mellitus without complications: Secondary | ICD-10-CM

## 2018-01-19 DIAGNOSIS — IMO0001 Reserved for inherently not codable concepts without codable children: Secondary | ICD-10-CM

## 2018-01-29 ENCOUNTER — Other Ambulatory Visit: Payer: Self-pay

## 2018-01-29 ENCOUNTER — Ambulatory Visit (INDEPENDENT_AMBULATORY_CARE_PROVIDER_SITE_OTHER): Payer: 59 | Admitting: Obstetrics and Gynecology

## 2018-01-29 ENCOUNTER — Encounter: Payer: Self-pay | Admitting: Obstetrics and Gynecology

## 2018-01-29 VITALS — BP 140/87 | HR 108 | Wt 247.0 lb

## 2018-01-29 DIAGNOSIS — O26893 Other specified pregnancy related conditions, third trimester: Secondary | ICD-10-CM

## 2018-01-29 DIAGNOSIS — O0990 Supervision of high risk pregnancy, unspecified, unspecified trimester: Secondary | ICD-10-CM

## 2018-01-29 DIAGNOSIS — Z6791 Unspecified blood type, Rh negative: Secondary | ICD-10-CM

## 2018-01-29 DIAGNOSIS — O26899 Other specified pregnancy related conditions, unspecified trimester: Secondary | ICD-10-CM

## 2018-01-29 DIAGNOSIS — E1165 Type 2 diabetes mellitus with hyperglycemia: Secondary | ICD-10-CM

## 2018-01-29 DIAGNOSIS — O0993 Supervision of high risk pregnancy, unspecified, third trimester: Secondary | ICD-10-CM

## 2018-01-29 NOTE — Progress Notes (Signed)
   PRENATAL VISIT NOTE  Subjective:  Angela Barry is a 22 y.o. G2P0010 at [redacted]w[redacted]d being seen today for ongoing prenatal care.  She is currently monitored for the following issues for this high-risk pregnancy and has Uncontrolled type 2 diabetes mellitus with hyperglycemia, without long-term current use of insulin (HCC); Supervision of high risk pregnancy, antepartum; Obesity during pregnancy with antepartum complication; Rh negative state in antepartum period; and Vitamin D deficiency on their problem list.  Patient reports no complaints.  Contractions: Not present. Vag. Bleeding: None.  Movement: Present. Denies leaking of fluid.   The following portions of the patient's history were reviewed and updated as appropriate: allergies, current medications, past family history, past medical history, past social history, past surgical history and problem list. Problem list updated.  Objective:   Vitals:   01/29/18 1318  BP: 140/87  Pulse: (!) 108  Weight: 247 lb (112 kg)    Fetal Status:     Movement: Present     General:  Alert, oriented and cooperative. Patient is in no acute distress.  Skin: Skin is warm and dry. No rash noted.   Cardiovascular: Normal heart rate noted  Respiratory: Normal respiratory effort, no problems with respiration noted  Abdomen: Soft, gravid, appropriate for gestational age.  Pain/Pressure: Absent     Pelvic: Cervical exam deferred        Extremities: Normal range of motion.  Edema: None  Mental Status: Normal mood and affect. Normal behavior. Normal judgment and thought content.   Assessment and Plan:  Pregnancy: G2P0010 at [redacted]w[redacted]d  1. Supervision of high risk pregnancy, antepartum  2. Rh negative state in antepartum period  3. Uncontrolled type 2 diabetes mellitus with hyperglycemia, without long-term current use of insulin (HCC) basaglar 30 units BID and novolog 20 units TID FG: all > 120  PP: 118-230 Follows with [facility] endo Last growth  83rd%tile Next growth 11/21 NST/BPP weekly starting 1 week Increase basaglar to 34 units BID, novolog 24 units TID Reminded to call and get optho appt in [place], does not want referral   Preterm labor symptoms and general obstetric precautions including but not limited to vaginal bleeding, contractions, leaking of fluid and fetal movement were reviewed in detail with the patient. Please refer to After Visit Summary for other counseling recommendations.  Return in about 1 week (around 02/05/2018) for OB visit (MD).  Future Appointments  Date Time Provider Department Center  02/05/2018  4:15 PM Constant, Gigi Gin, MD [facility] None  02/12/2018  1:30 PM [facility] [place] 1 [facility]  02/17/2018 12:30 PM [facility] [place] 1 [facility]    Conan Bowens, MD

## 2018-01-29 NOTE — Patient Instructions (Signed)
Etonogestrel implant What is this medicine? ETONOGESTREL (et oh noe JES trel) is a contraceptive (birth control) device. It is used to prevent pregnancy. It can be used for up to 3 years. This medicine may be used for other purposes; ask your health care provider or pharmacist if you have questions. COMMON BRAND NAME(S): Implanon, Nexplanon What should I tell my health care provider before I take this medicine? They need to know if you have any of these conditions: -abnormal vaginal bleeding -blood vessel disease or blood clots -cancer of the breast, cervix, or liver -depression -diabetes -gallbladder disease -headaches -heart disease or recent heart attack -high blood pressure -high cholesterol -kidney disease -liver disease -renal disease -seizures -tobacco smoker -an unusual or allergic reaction to etonogestrel, other hormones, anesthetics or antiseptics, medicines, foods, dyes, or preservatives -pregnant or trying to get pregnant -breast-feeding How should I use this medicine? This device is inserted just under the skin on the inner side of your upper arm by a health care professional. Talk to your pediatrician regarding the use of this medicine in children. Special care may be needed. Overdosage: If you think you have taken too much of this medicine contact a poison control center or emergency room at once. NOTE: This medicine is only for you. Do not share this medicine with others. What if I miss a dose? This does not apply. What may interact with this medicine? Do not take this medicine with any of the following medications: -amprenavir -bosentan -fosamprenavir This medicine may also interact with the following medications: -barbiturate medicines for inducing sleep or treating seizures -certain medicines for fungal infections like ketoconazole and itraconazole -grapefruit juice -griseofulvin -medicines to treat seizures like carbamazepine, felbamate, oxcarbazepine,  phenytoin, topiramate -modafinil -phenylbutazone -rifampin -rufinamide -some medicines to treat HIV infection like atazanavir, indinavir, lopinavir, nelfinavir, tipranavir, ritonavir -St. John's wort This list may not describe all possible interactions. Give your health care provider a list of all the medicines, herbs, non-prescription drugs, or dietary supplements you use. Also tell them if you smoke, drink alcohol, or use illegal drugs. Some items may interact with your medicine. What should I watch for while using this medicine? This product does not protect you against HIV infection (AIDS) or other sexually transmitted diseases. You should be able to feel the implant by pressing your fingertips over the skin where it was inserted. Contact your doctor if you cannot feel the implant, and use a non-hormonal birth control method (such as condoms) until your doctor confirms that the implant is in place. If you feel that the implant may have broken or become bent while in your arm, contact your healthcare provider. What side effects may I notice from receiving this medicine? Side effects that you should report to your doctor or health care professional as soon as possible: -allergic reactions like skin rash, itching or hives, swelling of the face, lips, or tongue -breast lumps -changes in emotions or moods -depressed mood -heavy or prolonged menstrual bleeding -pain, irritation, swelling, or bruising at the insertion site -scar at site of insertion -signs of infection at the insertion site such as fever, and skin redness, pain or discharge -signs of pregnancy -signs and symptoms of a blood clot such as breathing problems; changes in vision; chest pain; severe, sudden headache; pain, swelling, warmth in the leg; trouble speaking; sudden numbness or weakness of the face, arm or leg -signs and symptoms of liver injury like dark yellow or brown urine; general ill feeling or flu-like symptoms;  light-colored   stools; loss of appetite; nausea; right upper belly pain; unusually weak or tired; yellowing of the eyes or skin -unusual vaginal bleeding, discharge -signs and symptoms of a stroke like changes in vision; confusion; trouble speaking or understanding; severe headaches; sudden numbness or weakness of the face, arm or leg; trouble walking; dizziness; loss of balance or coordination Side effects that usually do not require medical attention (report to your doctor or health care professional if they continue or are bothersome): -acne -back pain -breast pain -changes in weight -dizziness -general ill feeling or flu-like symptoms -headache -irregular menstrual bleeding -nausea -sore throat -vaginal irritation or inflammation This list may not describe all possible side effects. Call your doctor for medical advice about side effects. You may report side effects to FDA at 1-800-FDA-1088. Where should I keep my medicine? This drug is given in a hospital or clinic and will not be stored at home. NOTE: This sheet is a summary. It may not cover all possible information. If you have questions about this medicine, talk to your doctor, pharmacist, or health care provider.  2018 Elsevier/Gold Standard (2015-10-05 11:19:22) Intrauterine Device Information An intrauterine device (IUD) is inserted into your uterus to prevent pregnancy. There are two types of IUDs available:  Copper IUD-This type of IUD is wrapped in copper wire and is placed inside the uterus. Copper makes the uterus and fallopian tubes produce a fluid that kills sperm. The copper IUD can stay in place for 10 years.  Hormone IUD-This type of IUD contains the hormone progestin (synthetic progesterone). The hormone thickens the cervical mucus and prevents sperm from entering the uterus. It also thins the uterine lining to prevent implantation of a fertilized egg. The hormone can weaken or kill the sperm that get into the uterus.  One type of hormone IUD can stay in place for 5 years, and another type can stay in place for 3 years.  Your health care provider will make sure you are a good candidate for a contraceptive IUD. Discuss with your health care provider the possible side effects. Advantages of an intrauterine device  IUDs are highly effective, reversible, long acting, and low maintenance.  There are no estrogen-related side effects.  An IUD can be used when breastfeeding.  IUDs are not associated with weight gain.  The copper IUD works immediately after insertion.  The hormone IUD works right away if inserted within 7 days of your period starting. You will need to use a backup method of birth control for 7 days if the hormone IUD is inserted at any other time in your cycle.  The copper IUD does not interfere with your female hormones.  The hormone IUD can make heavy menstrual periods lighter and decrease cramping.  The hormone IUD can be used for 3 or 5 years.  The copper IUD can be used for 10 years. Disadvantages of an intrauterine device  The hormone IUD can be associated with irregular bleeding patterns.  The copper IUD can make your menstrual flow heavier and more painful.  You may experience cramping and vaginal bleeding after insertion. This information is not intended to replace advice given to you by your health care provider. Make sure you discuss any questions you have with your health care provider. Document Released: 02/20/2004 Document Revised: 08/24/2015 Document Reviewed: 09/06/2012 Elsevier Interactive Patient Education  2017 Elsevier Inc.  

## 2018-02-05 ENCOUNTER — Inpatient Hospital Stay (HOSPITAL_BASED_OUTPATIENT_CLINIC_OR_DEPARTMENT_OTHER): Payer: 59

## 2018-02-05 ENCOUNTER — Ambulatory Visit (INDEPENDENT_AMBULATORY_CARE_PROVIDER_SITE_OTHER): Payer: 59 | Admitting: Obstetrics and Gynecology

## 2018-02-05 ENCOUNTER — Encounter (HOSPITAL_COMMUNITY): Payer: Self-pay | Admitting: *Deleted

## 2018-02-05 ENCOUNTER — Inpatient Hospital Stay (HOSPITAL_COMMUNITY)
Admission: AD | Admit: 2018-02-05 | Discharge: 2018-02-05 | Disposition: A | Discharge status: Home / Self Care | Payer: 59 | Source: Ambulatory Visit | Attending: Obstetrics and Gynecology | Admitting: Obstetrics and Gynecology

## 2018-02-05 ENCOUNTER — Encounter: Payer: Self-pay | Admitting: Obstetrics and Gynecology

## 2018-02-05 ENCOUNTER — Other Ambulatory Visit: Payer: Self-pay

## 2018-02-05 DIAGNOSIS — O133 Gestational [pregnancy-induced] hypertension without significant proteinuria, third trimester: Secondary | ICD-10-CM | POA: Diagnosis not present

## 2018-02-05 DIAGNOSIS — O289 Unspecified abnormal findings on antenatal screening of mother: Secondary | ICD-10-CM

## 2018-02-05 DIAGNOSIS — O360130 Maternal care for anti-D [Rh] antibodies, third trimester, not applicable or unspecified: Secondary | ICD-10-CM

## 2018-02-05 DIAGNOSIS — Z3A31 31 weeks gestation of pregnancy: Secondary | ICD-10-CM

## 2018-02-05 DIAGNOSIS — E1165 Type 2 diabetes mellitus with hyperglycemia: Secondary | ICD-10-CM

## 2018-02-05 DIAGNOSIS — O1414 Severe pre-eclampsia complicating childbirth: Secondary | ICD-10-CM

## 2018-02-05 DIAGNOSIS — O24113 Pre-existing diabetes mellitus, type 2, in pregnancy, third trimester: Secondary | ICD-10-CM

## 2018-02-05 DIAGNOSIS — Z7982 Long term (current) use of aspirin: Secondary | ICD-10-CM | POA: Diagnosis not present

## 2018-02-05 DIAGNOSIS — O99213 Obesity complicating pregnancy, third trimester: Secondary | ICD-10-CM

## 2018-02-05 DIAGNOSIS — O0990 Supervision of high risk pregnancy, unspecified, unspecified trimester: Secondary | ICD-10-CM

## 2018-02-05 DIAGNOSIS — Z6791 Unspecified blood type, Rh negative: Secondary | ICD-10-CM

## 2018-02-05 DIAGNOSIS — O99210 Obesity complicating pregnancy, unspecified trimester: Secondary | ICD-10-CM

## 2018-02-05 DIAGNOSIS — O26899 Other specified pregnancy related conditions, unspecified trimester: Secondary | ICD-10-CM

## 2018-02-05 DIAGNOSIS — R030 Elevated blood-pressure reading, without diagnosis of hypertension: Secondary | ICD-10-CM | POA: Diagnosis present

## 2018-02-05 LAB — CBC
HEMATOCRIT: 41.6 % (ref 36.0–46.0)
Hemoglobin: 13.8 g/dL (ref 12.0–15.0)
MCH: 30.1 pg (ref 26.0–34.0)
MCHC: 33.2 g/dL (ref 30.0–36.0)
MCV: 90.8 fL (ref 80.0–100.0)
NRBC: 0 % (ref 0.0–0.2)
PLATELETS: 210 10*3/uL (ref 150–400)
RBC: 4.58 MIL/uL (ref 3.87–5.11)
RDW: 13.3 % (ref 11.5–15.5)
WBC: 7.4 10*3/uL (ref 4.0–10.5)

## 2018-02-05 LAB — URINALYSIS, ROUTINE W REFLEX MICROSCOPIC
Bilirubin Urine: NEGATIVE
GLUCOSE, UA: 50 mg/dL — AB
Hgb urine dipstick: NEGATIVE
Ketones, ur: NEGATIVE mg/dL
Leukocytes, UA: NEGATIVE
NITRITE: NEGATIVE
PH: 6 (ref 5.0–8.0)
PROTEIN: 30 mg/dL — AB
Specific Gravity, Urine: 1.021 (ref 1.005–1.030)

## 2018-02-05 LAB — COMPREHENSIVE METABOLIC PANEL
ALT: 12 U/L (ref 0–44)
ANION GAP: 9 (ref 5–15)
AST: 14 U/L — ABNORMAL LOW (ref 15–41)
Albumin: 3.2 g/dL — ABNORMAL LOW (ref 3.5–5.0)
Alkaline Phosphatase: 104 U/L (ref 38–126)
BILIRUBIN TOTAL: 0.6 mg/dL (ref 0.3–1.2)
BUN: 11 mg/dL (ref 6–20)
CHLORIDE: 105 mmol/L (ref 98–111)
CO2: 23 mmol/L (ref 22–32)
Calcium: 9.4 mg/dL (ref 8.9–10.3)
Creatinine, Ser: 0.92 mg/dL (ref 0.44–1.00)
Glucose, Bld: 130 mg/dL — ABNORMAL HIGH (ref 70–99)
POTASSIUM: 4.3 mmol/L (ref 3.5–5.1)
Sodium: 137 mmol/L (ref 135–145)
TOTAL PROTEIN: 6.5 g/dL (ref 6.5–8.1)

## 2018-02-05 LAB — PROTEIN / CREATININE RATIO, URINE
Creatinine, Urine: 178 mg/dL
PROTEIN CREATININE RATIO: 0.19 mg/mg{creat} — AB (ref 0.00–0.15)
TOTAL PROTEIN, URINE: 34 mg/dL

## 2018-02-05 MED ORDER — BUTALBITAL-APAP-CAFFEINE 50-325-40 MG PO TABS
2.0000 | ORAL_TABLET | Freq: Once | ORAL | Status: AC
  Administered 2018-02-05: 2 via ORAL
  Filled 2018-02-05: qty 2

## 2018-02-05 NOTE — Discharge Instructions (Signed)
Hypertension During Pregnancy °Hypertension, commonly called high blood pressure, is when the force of blood pumping through your arteries is too strong. Arteries are blood vessels that carry blood from the heart throughout the body. Hypertension during pregnancy can cause problems for you and your baby. Your baby may be born early (prematurely) or may not weigh as much as he or she should at birth. Very bad cases of hypertension during pregnancy can be life-threatening. °Different types of hypertension can occur during pregnancy. These include: °· Chronic hypertension. This happens when: °? You have hypertension before pregnancy and it continues during pregnancy. °? You develop hypertension before you are [redacted] weeks pregnant, and it continues during pregnancy. °· Gestational hypertension. This is hypertension that develops after the 20th week of pregnancy. °· Preeclampsia, also called toxemia of pregnancy. This is a very serious type of hypertension that develops only during pregnancy. It affects the whole body, and it can be very dangerous for you and your baby. ° °Gestational hypertension and preeclampsia usually go away within 6 weeks after your baby is born. Women who have hypertension during pregnancy have a greater chance of developing hypertension later in life or during future pregnancies. °What are the causes? °The exact cause of hypertension is not known. °What increases the risk? °There are certain factors that make it more likely for you to develop hypertension during pregnancy. These include: °· Having hypertension during a previous pregnancy or prior to pregnancy. °· Being overweight. °· Being older than age 40. °· Being pregnant for the first time or being pregnant with more than one baby. °· Becoming pregnant using fertilization methods such as IVF (in vitro fertilization). °· Having diabetes, kidney problems, or systemic lupus erythematosus. °· Having a family history of hypertension. ° °What are the  signs or symptoms? °Chronic hypertension and gestational hypertension rarely cause symptoms. Preeclampsia causes symptoms, which may include: °· Increased protein in your urine. Your health care provider will check for this at every visit before you give birth (prenatal visit). °· Severe headaches. °· Sudden weight gain. °· Swelling of the hands, face, legs, and feet. °· Nausea and vomiting. °· Vision problems, such as blurred or double vision. °· Numbness in the face, arms, legs, and feet. °· Dizziness. °· Slurred speech. °· Sensitivity to bright lights. °· Abdominal pain. °· Convulsions. ° °How is this diagnosed? °You may be diagnosed with hypertension during a routine prenatal exam. At each prenatal visit, you may: °· Have a urine test to check for high amounts of protein in your urine. °· Have your blood pressure checked. A blood pressure reading is recorded as two numbers, such as "120 over 80" (or 120/80). The first ("top") number is called the systolic pressure. It is a measure of the pressure in your arteries when your heart beats. The second ("bottom") number is called the diastolic pressure. It is a measure of the pressure in your arteries as your heart relaxes between beats. Blood pressure is measured in a unit called mm Hg. A normal blood pressure reading is: °? Systolic: below 120. °? Diastolic: below 80. ° °The type of hypertension that you are diagnosed with depends on your test results and when your symptoms developed. °· Chronic hypertension is usually diagnosed before 20 weeks of pregnancy. °· Gestational hypertension is usually diagnosed after 20 weeks of pregnancy. °· Hypertension with high amounts of protein in the urine is diagnosed as preeclampsia. °· Blood pressure measurements that stay above 160 systolic, or above 110 diastolic, are   signs of severe preeclampsia. ° °How is this treated? °Treatment for hypertension during pregnancy varies depending on the type of hypertension you have and how  serious it is. °· If you take medicines called ACE inhibitors to treat chronic hypertension, you may need to switch medicines. ACE inhibitors should not be taken during pregnancy. °· If you have gestational hypertension, you may need to take blood pressure medicine. °· If you are at risk for preeclampsia, your health care provider may recommend that you take a low-dose aspirin every day to prevent high blood pressure during your pregnancy. °· If you have severe preeclampsia, you may need to be hospitalized so you and your baby can be monitored closely. You may also need to take medicine (magnesium sulfate) to prevent seizures and to lower blood pressure. This medicine may be given as an injection or through an IV tube. °· In some cases, if your condition gets worse, you may need to deliver your baby early. ° °Follow these instructions at home: °Eating and drinking °· Drink enough fluid to keep your urine clear or pale yellow. °· Eat a healthy diet that is low in salt (sodium). Do not add salt to your food. Check food labels to see how much sodium a food or beverage contains. °Lifestyle °· Do not use any products that contain nicotine or tobacco, such as cigarettes and e-cigarettes. If you need help quitting, ask your health care provider. °· Do not use alcohol. °· Avoid caffeine. °· Avoid stress as much as possible. Rest and get plenty of sleep. °General instructions °· Take over-the-counter and prescription medicines only as told by your health care provider. °· While lying down, lie on your left side. This keeps pressure off your baby. °· While sitting or lying down, raise (elevate) your feet. Try putting some pillows under your lower legs. °· Exercise regularly. Ask your health care provider what kinds of exercise are best for you. °· Keep all prenatal and follow-up visits as told by your health care provider. This is important. °Contact a health care provider if: °· You have symptoms that your health care  provider told you may require more treatment or monitoring, such as: °? Fever. °? Vomiting. °? Headache. °Get help right away if: °· You have severe abdominal pain or vomiting that does not get better with treatment. °· You suddenly develop swelling in your hands, ankles, or face. °· You gain 4 lbs (1.8 kg) or more in 1 week. °· You develop vaginal bleeding, or you have blood in your urine. °· You do not feel your baby moving as much as usual. °· You have blurred or double vision. °· You have muscle twitching or sudden tightening (spasms). °· You have shortness of breath. °· Your lips or fingernails turn blue. °This information is not intended to replace advice given to you by your health care provider. Make sure you discuss any questions you have with your health care provider. °Document Released: 12/04/2010 Document Revised: 10/06/2015 Document Reviewed: 09/01/2015 °Elsevier Interactive Patient Education © 2018 Elsevier Inc. ° °

## 2018-02-05 NOTE — [facility] Provider Note (Signed)
History     CSN: [id number]  Arrival date and time: 02/05/18 1644   First Provider Initiated Contact with Patient 02/05/18 1740      Chief Complaint  Patient presents with  . Headache  . Hypertension   HPI Angela Barry is a 22 y.o. G2P0010 at [redacted]w[redacted]d who presents from the office for evaluation of elevated blood pressures and a headache. She states she first noticed the headache on Tuesday and tried tylenol with relief for 1 hour but states the headache always comes back. It has consistently been a 6/10 since Tuesday. She denies any visual changes or epigastric pain. She denies bleeding or leaking of fluid. Reports normal fetal movement.   OB History    Gravida  2   Para      Term      Preterm      AB  1   Living        SAB  1   TAB      Ectopic      Multiple      Live Births              Past Medical History:  Diagnosis Date  . Diabetes mellitus without complication [facility])     Past Surgical History:  Procedure Laterality Date  . NO PAST SURGERIES      Family History  Problem Relation Age of Onset  . Diabetes Father   . Diabetes Paternal Grandmother   . Diabetes Paternal Grandfather   . Hypertension Neg Hx     Social History   Tobacco Use  . Smoking status: Never Smoker  . Smokeless tobacco: Never Used  Substance Use Topics  . Alcohol use: Not Currently  . Drug use: Never    Allergies: No Known Allergies  Facility-Administered Medications Prior to Admission  Medication Dose Route Frequency Provider Last Rate Last Dose  . Insulin Pen Needle (NOVOFINE) 10 each  1 packet Subcutaneous PRN Denney, Rachelle A, CNM       Medications Prior to Admission  Medication Sig Dispense Refill Last Dose  . aspirin EC 81 MG tablet Take 1 tablet (81 mg total) by mouth daily. Take after 12 weeks for prevention of preeclampsia later in pregnancy 300 tablet 2 Taking  . glucose blood (ACCU-CHEK COMPACT TEST DRUM) test strip Use as instructed to check blood  sugar 3 times daily. 100 each 3 Taking  . glucose blood (ACCU-CHEK GUIDE) test strip 1 each by Other route as needed for other. Use as instructed to check blood sugar 4 times daily 200 each 3 Taking  . insulin aspart (NOVOLOG) 100 UNIT/ML injection Inject 18 Units into the skin 3 (three) times daily with meals. 10 mL 12 Taking  . Insulin Glargine (BASAGLAR KWIKPEN) 100 UNIT/ML SOPN Inject 0.3 mLs (30 Units total) into the skin 2 (two) times daily. 45 mL 1 Taking  . Insulin Pen Needle 31G X 4 MM MISC 1 Package by Does not apply route 3 (three) times daily before meals. 90 each 3 Taking  . Insulin Syringe-Needle U-100 (BD INSULIN SYRINGE) 29G X 1/2" 0.3 ML MISC Use  To inject into skin 3 times daily before meals 100 each 12 Taking  . Lancets MISC Use to check blood sugar as instructed. 200 each 3 Taking  . metFORMIN (GLUCOPHAGE-XR) 500 MG 24 hr tablet TAKE 4 TABLETS (2,000 MG TOTAL) BY MOUTH DAILY WITH SUPPER. 360 tablet 1 Taking  . Prenatal Vit w/Fe-Methylfol-FA (PNV PO) Take 1 tablet by  mouth.   Taking  . sertraline (ZOLOFT) 50 MG tablet Take 1 tablet (50 mg total) by mouth daily. 30 tablet 3 Taking  . Vitamin D, Ergocalciferol, (DRISDOL) 50000 units CAPS capsule Take 1 capsule (50,000 Units total) by mouth every 7 (seven) days. 30 capsule 2 Taking    Review of Systems  Constitutional: Negative.  Negative for fatigue and fever.  HENT: Negative.   Eyes: Negative for visual disturbance.  Respiratory: Negative.  Negative for shortness of breath.   Cardiovascular: Negative.  Negative for chest pain.  Gastrointestinal: Negative.  Negative for abdominal pain, constipation, diarrhea, nausea and vomiting.  Genitourinary: Negative.  Negative for dysuria, vaginal bleeding and vaginal discharge.  Neurological: Positive for headaches. Negative for dizziness.   Physical Exam   Blood pressure 133/90, pulse (!) 106, temperature 98.1 F (36.7 C), temperature source Oral, resp. rate 18, weight 111.1 kg,  last menstrual period 06/16/2017, SpO2 100 %.   Patient Vitals for the past 24 hrs:  BP Temp Temp src Pulse Resp SpO2 Weight  02/05/18 1918 139/79 98.3 F (36.8 C) Oral (!) 101 20 98 % -  02/05/18 1815 133/90 - - (!) 106 - - -  02/05/18 1800 123/83 - - (!) 106 - - -  02/05/18 1745 139/84 - - (!) 104 - - -  02/05/18 1731 137/83 - - (!) 104 - - -  02/05/18 1723 (!) 145/88 - - (!) 110 - - -  02/05/18 1703 135/77 98.1 F (36.7 C) Oral (!) 114 18 100 % 111.1 kg     Physical Exam  Nursing note and vitals reviewed. Constitutional: She is oriented to person, place, and time. She appears well-developed and well-nourished. No distress.  HENT:  Head: Normocephalic.  Eyes: Pupils are equal, round, and reactive to light.  Cardiovascular: Normal rate, regular rhythm and normal heart sounds.  Respiratory: Effort normal and breath sounds normal. No respiratory distress.  GI: Soft. Bowel sounds are normal. She exhibits no distension. There is no tenderness.  Neurological: She is alert and oriented to person, place, and time. She has normal reflexes. No cranial nerve deficit. She exhibits normal muscle tone. Coordination normal.  Skin: Skin is warm and dry.  Psychiatric: She has a normal mood and affect. Her behavior is normal. Judgment and thought content normal.    Fetal Tracing:  Baseline: 150 Variability: moderate Accels: 10x10 Decels: none  Toco: none  MAU Course  Procedures Results for orders placed or performed during the hospital encounter of 02/05/18 (from the past 24 hour(s))  CBC     Status: None   Collection Time: 02/05/18  4:59 PM  Result Value Ref Range   WBC 7.4 4.0 - 10.5 K/uL   RBC 4.58 3.87 - 5.11 MIL/uL   Hemoglobin 13.8 12.0 - 15.0 g/dL   HCT 16.1 09.6 - 04.5 %   MCV 90.8 80.0 - 100.0 fL   MCH 30.1 26.0 - 34.0 pg   MCHC 33.2 30.0 - 36.0 g/dL   RDW 40.9 81.1 - 91.4 %   Platelets 210 150 - 400 K/uL   nRBC 0.0 0.0 - 0.2 %  Comprehensive metabolic panel      Status: Abnormal   Collection Time: 02/05/18  4:59 PM  Result Value Ref Range   Sodium 137 135 - 145 mmol/L   Potassium 4.3 3.5 - 5.1 mmol/L   Chloride 105 98 - 111 mmol/L   CO2 23 22 - 32 mmol/L   Glucose, Bld 130 (H) 70 -  99 mg/dL   BUN 11 6 - 20 mg/dL   Creatinine, Ser 2.95 0.44 - 1.00 mg/dL   Calcium 9.4 8.9 - 62.1 mg/dL   Total Protein 6.5 6.5 - 8.1 g/dL   Albumin 3.2 (L) 3.5 - 5.0 g/dL   AST 14 (L) 15 - 41 U/L   ALT 12 0 - 44 U/L   Alkaline Phosphatase 104 38 - 126 U/L   Total Bilirubin 0.6 0.3 - 1.2 mg/dL   GFR calc non Af Amer >60 >60 mL/min   GFR calc Af Amer >60 >60 mL/min   Anion gap 9 5 - 15  Protein / creatinine ratio, urine     Status: Abnormal   Collection Time: 02/05/18  5:14 PM  Result Value Ref Range   Creatinine, Urine 178.00 mg/dL   Total Protein, Urine 34 mg/dL   Protein Creatinine Ratio 0.19 (H) 0.00 - 0.15 mg/mg[Cre]  Urinalysis, Routine w reflex microscopic     Status: Abnormal   Collection Time: 02/05/18  5:14 PM  Result Value Ref Range   Color, Urine YELLOW YELLOW   APPearance HAZY (A) CLEAR   Specific Gravity, Urine 1.021 1.005 - 1.030   pH 6.0 5.0 - 8.0   Glucose, UA 50 (A) NEGATIVE mg/dL   Hgb urine dipstick NEGATIVE NEGATIVE   Bilirubin Urine NEGATIVE NEGATIVE   Ketones, ur NEGATIVE NEGATIVE mg/dL   Protein, ur 30 (A) NEGATIVE mg/dL   Nitrite NEGATIVE NEGATIVE   Leukocytes, UA NEGATIVE NEGATIVE   RBC / HPF 0-5 0 - 5 RBC/hpf   WBC, UA 0-5 0 - 5 WBC/hpf   Bacteria, UA RARE (A) NONE SEEN   Squamous Epithelial / LPF 11-20 0 - 5   Mucus PRESENT    MDM UA CBC, CMP, Protein/creat ratio Non reactive NST [place] MFM BPP- 8/8 with normal AFI Fioricet PO- patient reports relief of headache  Consulted with Dr. Erin Fulling- ok to discharge patient home with follow up in 1 week, patient scheduled for NST/BP check on Monday  Assessment and Plan   1. Gestational hypertension, third trimester   2. [redacted] weeks gestation of pregnancy    -Discharge  home in stable condition -Strict preeclampsia precautions discussed -Patient advised to follow-up with [facility] on Monday as scheduled -Patient may return to [facility]U as needed or if her condition were to change or worsen   Rolm Bookbinder CNM 02/05/2018, 7:13 PM

## 2018-02-05 NOTE — Progress Notes (Signed)
   PRENATAL VISIT NOTE  Subjective:  Angela Barry is a 22 y.o. G2P0010 at [redacted]w[redacted]d being seen today for ongoing prenatal care.  She is currently monitored for the following issues for this high-risk pregnancy and has Uncontrolled type 2 diabetes mellitus with hyperglycemia, without long-term current use of insulin (HCC); Supervision of high risk pregnancy, antepartum; Obesity during pregnancy with antepartum complication; Rh negative state in antepartum period; and Vitamin D deficiency on their problem list.  Patient reports headache.  Contractions: Not present. Vag. Bleeding: None.  Movement: Present. Denies leaking of fluid.   The following portions of the patient's history were reviewed and updated as appropriate: allergies, current medications, past family history, past medical history, past social history, past surgical history and problem list. Problem list updated.  Objective:  There were no vitals filed for this visit.  Fetal Status: Fetal Heart Rate (bpm): 168 Fundal Height: 33 cm Movement: Present     General:  Alert, oriented and cooperative. Patient is in no acute distress.  Skin: Skin is warm and dry. No rash noted.   Cardiovascular: Normal heart rate noted  Respiratory: Normal respiratory effort, no problems with respiration noted  Abdomen: Soft, gravid, appropriate for gestational age.  Pain/Pressure: Absent     Pelvic: Cervical exam deferred        Extremities: Normal range of motion.  Edema: Trace  Mental Status: Normal mood and affect. Normal behavior. Normal judgment and thought content.   Assessment and Plan:  Pregnancy: G2P0010 at [redacted]w[redacted]d  1. Supervision of high risk pregnancy, antepartum Patient reports a 3-day headache not relieved by tylenol Elevated BP today Patient reports decrease fetal movement Will send to [facility]U for further evaluation and rule out pre-e  2. Uncontrolled type 2 diabetes mellitus with hyperglycemia, without long-term current use of insulin  (HCC) Patient is not checking CBGs 4 times daily Majority of CBG elevated Scheduled to follow up with endocrinologist next week Advised patient to adhere to diet which she is not doing  3. Rh negative state in antepartum period S/p rhogam  4. Obesity during pregnancy with antepartum complication Excessive weight gain in pregnancy  Preterm labor symptoms and general obstetric precautions including but not limited to vaginal bleeding, contractions, leaking of fluid and fetal movement were reviewed in detail with the patient. Please refer to After Visit Summary for other counseling recommendations.  No follow-ups on file.  Future Appointments  Date Time Provider Department Center  02/09/2018  1:30 PM Minoru Chap, Gigi Gin, MD [facility] None  02/12/2018  1:30 PM [facility] [place] [facility]  02/17/2018 12:30 PM [facility] [place] [facility]    Catalina Antigua, MD

## 2018-02-05 NOTE — MAU Note (Signed)
Pt has had a headache the last three days. Is a 5-6/10 today, tylenol not working. No bleeding or LOF. DFM yesterday, but moving more today. No visual changes or RUQ pain.

## 2018-02-09 ENCOUNTER — Ambulatory Visit (INDEPENDENT_AMBULATORY_CARE_PROVIDER_SITE_OTHER): Payer: 59 | Admitting: Obstetrics and Gynecology

## 2018-02-09 ENCOUNTER — Encounter: Payer: Self-pay | Admitting: Obstetrics and Gynecology

## 2018-02-09 VITALS — BP 135/84 | HR 101 | Wt 251.1 lb

## 2018-02-09 DIAGNOSIS — O26899 Other specified pregnancy related conditions, unspecified trimester: Secondary | ICD-10-CM

## 2018-02-09 DIAGNOSIS — O133 Gestational [pregnancy-induced] hypertension without significant proteinuria, third trimester: Secondary | ICD-10-CM

## 2018-02-09 DIAGNOSIS — O0990 Supervision of high risk pregnancy, unspecified, unspecified trimester: Secondary | ICD-10-CM

## 2018-02-09 DIAGNOSIS — E1165 Type 2 diabetes mellitus with hyperglycemia: Secondary | ICD-10-CM

## 2018-02-09 DIAGNOSIS — O0993 Supervision of high risk pregnancy, unspecified, third trimester: Secondary | ICD-10-CM

## 2018-02-09 DIAGNOSIS — O26893 Other specified pregnancy related conditions, third trimester: Secondary | ICD-10-CM

## 2018-02-09 DIAGNOSIS — Z6791 Unspecified blood type, Rh negative: Secondary | ICD-10-CM

## 2018-02-09 NOTE — Progress Notes (Signed)
Pt is here for ROB/NST G2P0010 [redacted]w[redacted]d.

## 2018-02-09 NOTE — Progress Notes (Signed)
   PRENATAL VISIT NOTE  Subjective:  Angela Barry is a 22 y.o. G2P0010 at [redacted]w[redacted]d being seen today for ongoing prenatal care.  She is currently monitored for the following issues for this high-risk pregnancy and has Uncontrolled type 2 diabetes mellitus with hyperglycemia, without long-term current use of insulin (HCC); Supervision of high risk pregnancy, antepartum; Obesity during pregnancy with antepartum complication; Rh negative state in antepartum period; Vitamin D deficiency; and Gestational hypertension on their problem list.  Patient reports no complaints.  Contractions: Not present. Vag. Bleeding: None.  Movement: Present. Denies leaking of fluid.   The following portions of the patient's history were reviewed and updated as appropriate: allergies, current medications, past family history, past medical history, past social history, past surgical history and problem list. Problem list updated.  Objective:   Vitals:   02/09/18 1344  BP: 135/84  Pulse: (!) 101  Weight: 251 lb 1.6 oz (113.9 kg)    Fetal Status: Fetal Heart Rate (bpm): 165 Fundal Height: 35 cm Movement: Present     General:  Alert, oriented and cooperative. Patient is in no acute distress.  Skin: Skin is warm and dry. No rash noted.   Cardiovascular: Normal heart rate noted  Respiratory: Normal respiratory effort, no problems with respiration noted  Abdomen: Soft, gravid, appropriate for gestational age.  Pain/Pressure: Present     Pelvic: Cervical exam deferred        Extremities: Normal range of motion.  Edema: None  Mental Status: Normal mood and affect. Normal behavior. Normal judgment and thought content.   Assessment and Plan:  Pregnancy: G2P0010 at [redacted]w[redacted]d  1. Supervision of high risk pregnancy, antepartum Patient is doing well without complaints  2. Uncontrolled type 2 diabetes mellitus with hyperglycemia, without long-term current use of insulin (HCC) Patient did not bring CBG log but states they are  unchanged in comparison to her last visit Patient scheduled to see endocrinologist later this week Reviewed with patient the importance of adhering to the diet Follow up growth ultrasound scheduled Antenanal testing- NST reviewed and reactive with baseline 150, mod variability, + accels, no decels Follow up BPP on 11/14  3. Rh negative state in antepartum period S/p rhogam  Preterm labor symptoms and general obstetric precautions including but not limited to vaginal bleeding, contractions, leaking of fluid and fetal movement were reviewed in detail with the patient. Please refer to After Visit Summary for other counseling recommendations.  Return in about 1 week (around 02/16/2018) for ROB, NST.  Future Appointments  Date Time Provider Department Center  02/12/2018  1:30 PM [facility] [place] [facility]  02/17/2018 12:30 PM [facility] [place] 1 [facility]    Catalina Antigua, MD

## 2018-02-12 ENCOUNTER — Ambulatory Visit (HOSPITAL_COMMUNITY)
Admission: RE | Admit: 2018-02-12 | Discharge: 2018-02-12 | Disposition: A | Discharge status: Home / Self Care | Payer: 59 | Source: Ambulatory Visit | Attending: Obstetrics and Gynecology | Admitting: Obstetrics and Gynecology

## 2018-02-12 ENCOUNTER — Encounter (HOSPITAL_COMMUNITY): Payer: Self-pay

## 2018-02-12 DIAGNOSIS — Z3A32 32 weeks gestation of pregnancy: Secondary | ICD-10-CM | POA: Diagnosis not present

## 2018-02-12 DIAGNOSIS — O99213 Obesity complicating pregnancy, third trimester: Secondary | ICD-10-CM | POA: Insufficient documentation

## 2018-02-12 DIAGNOSIS — O24113 Pre-existing diabetes mellitus, type 2, in pregnancy, third trimester: Secondary | ICD-10-CM | POA: Diagnosis not present

## 2018-02-12 DIAGNOSIS — E1165 Type 2 diabetes mellitus with hyperglycemia: Secondary | ICD-10-CM

## 2018-02-16 ENCOUNTER — Ambulatory Visit: Payer: 59 | Admitting: Endocrinology

## 2018-02-17 ENCOUNTER — Ambulatory Visit (INDEPENDENT_AMBULATORY_CARE_PROVIDER_SITE_OTHER): Payer: 59 | Admitting: Obstetrics and Gynecology

## 2018-02-17 ENCOUNTER — Ambulatory Visit (HOSPITAL_COMMUNITY)
Admission: RE | Admit: 2018-02-17 | Discharge: 2018-02-17 | Disposition: A | Discharge status: Home / Self Care | Payer: 59 | Source: Ambulatory Visit | Attending: Obstetrics | Admitting: Obstetrics

## 2018-02-17 ENCOUNTER — Encounter: Payer: Self-pay | Admitting: Obstetrics and Gynecology

## 2018-02-17 VITALS — BP 130/84 | HR 103 | Wt 246.9 lb

## 2018-02-17 DIAGNOSIS — O133 Gestational [pregnancy-induced] hypertension without significant proteinuria, third trimester: Secondary | ICD-10-CM

## 2018-02-17 DIAGNOSIS — E1165 Type 2 diabetes mellitus with hyperglycemia: Secondary | ICD-10-CM

## 2018-02-17 DIAGNOSIS — O0990 Supervision of high risk pregnancy, unspecified, unspecified trimester: Secondary | ICD-10-CM

## 2018-02-17 DIAGNOSIS — O0993 Supervision of high risk pregnancy, unspecified, third trimester: Secondary | ICD-10-CM

## 2018-02-17 NOTE — Progress Notes (Signed)
Subjective:  Angela Barry is a 22 y.o. G2P0010 at 7156w3d being seen today for ongoing prenatal care.  She is currently monitored for the following issues for this high-risk pregnancy and has Uncontrolled type 2 diabetes mellitus with hyperglycemia, without long-term current use of insulin (HCC); Supervision of high risk pregnancy, antepartum; Obesity during pregnancy with antepartum complication; Rh negative state in antepartum period; Vitamin D deficiency; and Gestational hypertension on their problem list.  Patient reports no complaints.  Contractions: Irregular. Vag. Bleeding: None.  Movement: Present. Denies leaking of fluid.   The following portions of the patient's history were reviewed and updated as appropriate: allergies, current medications, past family history, past medical history, past social history, past surgical history and problem list. Problem list updated.  Objective:   Vitals:   02/17/18 0945  BP: 130/84  Pulse: (!) 103  Weight: 246 lb 14.4 oz (112 kg)    Fetal Status: Fetal Heart Rate (bpm): NST   Movement: Present     General:  Alert, oriented and cooperative. Patient is in no acute distress.  Skin: Skin is warm and dry. No rash noted.   Cardiovascular: Normal heart rate noted  Respiratory: Normal respiratory effort, no problems with respiration noted  Abdomen: Soft, gravid, appropriate for gestational age. Pain/Pressure: Absent     Pelvic:  Cervical exam deferred        Extremities: Normal range of motion.  Edema: None  Mental Status: Normal mood and affect. Normal behavior. Normal judgment and thought content.   Urinalysis:      Assessment and Plan:  Pregnancy: G2P0010 at 6856w3d  1. Supervision of high risk pregnancy, antepartum Stable  2. Uncontrolled type 2 diabetes mellitus with hyperglycemia, without long-term current use of insulin (HCC) Did not bring CBG readings today. To make appt with endocrinologist today Importance of bringing CBG readings to  appt s discussed Reports fastings 130's and 2 hr PP 200's To discuss regiment changes with endo NST not reactive today BPP added to growth scan Continue with antenatal testing - US FETAL BPP WO NON STRESS; Future - US FETAL BPP WO NON STRESS; Future - US FETAL BPP WO NON STRESS; Future - US FETAL BPP WO NON STRESS; Future - US FETAL BPP WO NON STRESS; Future - US FETAL BPP WO NON STRESS; Future - Fetal nonstress test; Future - US MFM FETAL BPP WO NON STRESS; Future  3. Gestational hypertension, third trimester BP stable No S/Sx of PEC today - US FETAL BPP WO NON STRESS; Future - US FETAL BPP WO NON STRESS; Future - US FETAL BPP WO NON STRESS; Future - US FETAL BPP WO NON STRESS; Future - US FETAL BPP WO NON STRESS; Future - US FETAL BPP WO NON STRESS; Future - Fetal nonstress test; Future - US MFM FETAL BPP WO NON STRESS; Future  Preterm labor symptoms and general obstetric precautions including but not limited to vaginal bleeding, contractions, leaking of fluid and fetal movement were reviewed in detail with the patient. Please refer to After Visit Summary for other counseling recommendations.  Return in about 1 week (around 02/24/2018) for OB visit.   Hermina StaggersErvin, Michael L, MD

## 2018-02-17 NOTE — Progress Notes (Signed)
Patient reports good fetal movement with occasional contractions. Pt states that she forgot BG log at home, last reading 130 last night.

## 2018-02-18 ENCOUNTER — Ambulatory Visit (HOSPITAL_COMMUNITY)
Admission: RE | Admit: 2018-02-18 | Discharge: 2018-02-18 | Disposition: A | Discharge status: Home / Self Care | Payer: No Typology Code available for payment source | Source: Ambulatory Visit | Attending: Obstetrics | Admitting: Obstetrics

## 2018-02-18 ENCOUNTER — Encounter (HOSPITAL_COMMUNITY): Payer: Self-pay

## 2018-02-18 DIAGNOSIS — E119 Type 2 diabetes mellitus without complications: Secondary | ICD-10-CM

## 2018-02-18 DIAGNOSIS — O24113 Pre-existing diabetes mellitus, type 2, in pregnancy, third trimester: Secondary | ICD-10-CM

## 2018-02-18 DIAGNOSIS — O289 Unspecified abnormal findings on antenatal screening of mother: Secondary | ICD-10-CM

## 2018-02-18 DIAGNOSIS — Z3A33 33 weeks gestation of pregnancy: Secondary | ICD-10-CM | POA: Diagnosis not present

## 2018-02-18 DIAGNOSIS — Z794 Long term (current) use of insulin: Secondary | ICD-10-CM | POA: Insufficient documentation

## 2018-02-18 DIAGNOSIS — O133 Gestational [pregnancy-induced] hypertension without significant proteinuria, third trimester: Secondary | ICD-10-CM

## 2018-02-18 DIAGNOSIS — E1165 Type 2 diabetes mellitus with hyperglycemia: Secondary | ICD-10-CM

## 2018-02-18 DIAGNOSIS — O139 Gestational [pregnancy-induced] hypertension without significant proteinuria, unspecified trimester: Secondary | ICD-10-CM | POA: Diagnosis not present

## 2018-02-18 DIAGNOSIS — IMO0001 Reserved for inherently not codable concepts without codable children: Secondary | ICD-10-CM

## 2018-02-18 DIAGNOSIS — O99213 Obesity complicating pregnancy, third trimester: Secondary | ICD-10-CM | POA: Insufficient documentation

## 2018-02-18 DIAGNOSIS — O24313 Unspecified pre-existing diabetes mellitus in pregnancy, third trimester: Principal | ICD-10-CM

## 2018-02-19 ENCOUNTER — Other Ambulatory Visit: Payer: Self-pay | Admitting: Obstetrics and Gynecology

## 2018-02-19 ENCOUNTER — Other Ambulatory Visit (HOSPITAL_COMMUNITY): Payer: Self-pay | Admitting: *Deleted

## 2018-02-19 DIAGNOSIS — O24313 Unspecified pre-existing diabetes mellitus in pregnancy, third trimester: Principal | ICD-10-CM

## 2018-02-19 DIAGNOSIS — E1165 Type 2 diabetes mellitus with hyperglycemia: Secondary | ICD-10-CM

## 2018-02-19 DIAGNOSIS — IMO0001 Reserved for inherently not codable concepts without codable children: Secondary | ICD-10-CM

## 2018-02-19 DIAGNOSIS — Z794 Long term (current) use of insulin: Principal | ICD-10-CM

## 2018-02-19 DIAGNOSIS — O133 Gestational [pregnancy-induced] hypertension without significant proteinuria, third trimester: Secondary | ICD-10-CM

## 2018-02-23 ENCOUNTER — Encounter: Payer: Self-pay | Admitting: Obstetrics and Gynecology

## 2018-02-23 ENCOUNTER — Ambulatory Visit (INDEPENDENT_AMBULATORY_CARE_PROVIDER_SITE_OTHER): Payer: 59 | Admitting: Obstetrics and Gynecology

## 2018-02-23 ENCOUNTER — Ambulatory Visit (HOSPITAL_COMMUNITY)
Admission: RE | Admit: 2018-02-23 | Discharge: 2018-02-23 | Disposition: A | Discharge status: Home / Self Care | Payer: No Typology Code available for payment source | Source: Ambulatory Visit | Attending: Obstetrics and Gynecology | Admitting: Obstetrics and Gynecology

## 2018-02-23 ENCOUNTER — Encounter (HOSPITAL_COMMUNITY): Payer: Self-pay

## 2018-02-23 VITALS — BP 141/87 | HR 105 | Wt 247.0 lb

## 2018-02-23 DIAGNOSIS — O99213 Obesity complicating pregnancy, third trimester: Secondary | ICD-10-CM

## 2018-02-23 DIAGNOSIS — O99210 Obesity complicating pregnancy, unspecified trimester: Secondary | ICD-10-CM

## 2018-02-23 DIAGNOSIS — O24113 Pre-existing diabetes mellitus, type 2, in pregnancy, third trimester: Secondary | ICD-10-CM | POA: Insufficient documentation

## 2018-02-23 DIAGNOSIS — E1165 Type 2 diabetes mellitus with hyperglycemia: Secondary | ICD-10-CM

## 2018-02-23 DIAGNOSIS — O289 Unspecified abnormal findings on antenatal screening of mother: Secondary | ICD-10-CM

## 2018-02-23 DIAGNOSIS — O0993 Supervision of high risk pregnancy, unspecified, third trimester: Secondary | ICD-10-CM

## 2018-02-23 DIAGNOSIS — Z794 Long term (current) use of insulin: Secondary | ICD-10-CM | POA: Diagnosis not present

## 2018-02-23 DIAGNOSIS — Z3A34 34 weeks gestation of pregnancy: Secondary | ICD-10-CM

## 2018-02-23 DIAGNOSIS — O139 Gestational [pregnancy-induced] hypertension without significant proteinuria, unspecified trimester: Secondary | ICD-10-CM | POA: Diagnosis not present

## 2018-02-23 DIAGNOSIS — IMO0001 Reserved for inherently not codable concepts without codable children: Secondary | ICD-10-CM

## 2018-02-23 DIAGNOSIS — O26899 Other specified pregnancy related conditions, unspecified trimester: Secondary | ICD-10-CM

## 2018-02-23 DIAGNOSIS — O0990 Supervision of high risk pregnancy, unspecified, unspecified trimester: Secondary | ICD-10-CM

## 2018-02-23 DIAGNOSIS — O133 Gestational [pregnancy-induced] hypertension without significant proteinuria, third trimester: Secondary | ICD-10-CM

## 2018-02-23 DIAGNOSIS — O24313 Unspecified pre-existing diabetes mellitus in pregnancy, third trimester: Secondary | ICD-10-CM | POA: Diagnosis present

## 2018-02-23 DIAGNOSIS — Z6791 Unspecified blood type, Rh negative: Secondary | ICD-10-CM

## 2018-02-23 NOTE — ED Notes (Signed)
NST in progress.

## 2018-02-23 NOTE — Procedures (Signed)
Angela Barry 04/14/1995 6773w2d  Fetus A Non-Stress Test Interpretation for 02/23/18  Indication: T2DM-Insulin required  Fetal Heart Rate A Mode: External Baseline Rate (A): 145 bpm Variability: Moderate Accelerations: 10 x 10, 15 x 15 Decelerations: None Multiple birth?: No  Uterine Activity Mode: Palpation, Toco Contraction Frequency (min): 1 UC Contraction Duration (sec): 60 Contraction Quality: Mild Resting Tone Palpated: Relaxed Resting Time: Adequate  Interpretation (Fetal Testing) Nonstress Test Interpretation: Reactive Overall Impression: Reassuring for gestational age Comments: EFm tracing reviewed with Rudi CocoM. Robb, RN

## 2018-02-23 NOTE — Progress Notes (Addendum)
   PRENATAL VISIT NOTE  Subjective:  Angela Barry is a 22 y.o. G2P0010 at 7261w2d being seen today for ongoing prenatal care.  She is currently monitored for the following issues for this high-risk pregnancy and has Uncontrolled type 2 diabetes mellitus with hyperglycemia, without long-term current use of insulin (HCC); Supervision of high risk pregnancy, antepartum; Obesity during pregnancy with antepartum complication; Rh negative state in antepartum period; Vitamin D deficiency; and Gestational hypertension on their problem list.  Patient reports no complaints.  Contractions: Irritability. Vag. Bleeding: None.  Movement: Present. Denies leaking of fluid.   The following portions of the patient's history were reviewed and updated as appropriate: allergies, current medications, past family history, past medical history, past social history, past surgical history and problem list. Problem list updated.  Objective:   Vitals:   02/23/18 1517  BP: (!) 141/87  Pulse: (!) 105  Weight: 247 lb (112 kg)    Fetal Status: Fetal Heart Rate (bpm): 160   Movement: Present     General:  Alert, oriented and cooperative. Patient is in no acute distress.  Skin: Skin is warm and dry. No rash noted.   Cardiovascular: Normal heart rate noted  Respiratory: Normal respiratory effort, no problems with respiration noted  Abdomen: Soft, gravid, appropriate for gestational age.  Pain/Pressure: Present     Pelvic: Cervical exam deferred        Extremities: Normal range of motion.  Edema: None  Mental Status: Normal mood and affect. Normal behavior. Normal judgment and thought content.   Assessment and Plan:  Pregnancy: G2P0010 at 5761w2d  1. Supervision of high risk pregnancy, antepartum Patient is doing well without complaints Cultures next visit due to 37 weeks delivery for GHTN  2. Uncontrolled type 2 diabetes mellitus with hyperglycemia, without long-term current use of insulin (HCC) CBGs reviewed and  all values above 200 Patient has not been checking CBGs more than one a day prior to this week. She was seen by endocrinologist who was not able to make insulin adjustment in view of lack of data. Patient will fax values over for insulin dosage change  3. Obesity during pregnancy with antepartum complication   4. Rh negative state in antepartum period S/p rhogam  5. Gestational hypertension, third trimester Elevated BP today without symptoms Patient [facility]  Preterm labor symptoms and general obstetric precautions including but not limited to vaginal bleeding, contractions, leaking of fluid and fetal movement were reviewed in detail with the patient. Please refer to After Visit Summary for other counseling recommendations.  Return in about 1 week (around 03/02/2018) for ROB.  Future Appointments  Date Time Provider Department Center  03/02/2018  3:15 PM [facility] US 4 WH-MFCUS M[facility]-US  03/09/2018  2:15 PM [facility] US 4 WH-MFCUS MFC-US  03/09/2018  3:15 PM WH-MFC NST WH-[facility]FC [facility]-US  03/16/2018 12:45 PM [facility] US 2 WH-MFCUS M[facility]-US  03/16/2018  2:15 PM WH-[facility]FC NST [facility]-US    Catalina AntiguaPeggy Benetta Maclaren, MD

## 2018-03-02 ENCOUNTER — Ambulatory Visit (HOSPITAL_BASED_OUTPATIENT_CLINIC_OR_DEPARTMENT_OTHER)
Admission: RE | Admit: 2018-03-02 | Discharge: 2018-03-02 | Disposition: A | Discharge status: Home / Self Care | Payer: No Typology Code available for payment source | Source: Ambulatory Visit | Attending: Obstetrics and Gynecology | Admitting: Obstetrics and Gynecology

## 2018-03-02 ENCOUNTER — Ambulatory Visit (INDEPENDENT_AMBULATORY_CARE_PROVIDER_SITE_OTHER): Payer: 59 | Admitting: Obstetrics and Gynecology

## 2018-03-02 ENCOUNTER — Inpatient Hospital Stay (HOSPITAL_COMMUNITY)
Admission: AD | Admit: 2018-03-02 | Discharge: 2018-03-12 | DRG: 787 | Disposition: A | Discharge status: Home / Self Care | Payer: No Typology Code available for payment source | Attending: Obstetrics & Gynecology | Admitting: Obstetrics & Gynecology

## 2018-03-02 ENCOUNTER — Ambulatory Visit (HOSPITAL_COMMUNITY)
Admission: RE | Admit: 2018-03-02 | Discharge: 2018-03-02 | Disposition: A | Discharge status: Home / Self Care | Payer: No Typology Code available for payment source | Source: Ambulatory Visit | Attending: Obstetrics and Gynecology | Admitting: Obstetrics and Gynecology

## 2018-03-02 ENCOUNTER — Encounter (HOSPITAL_COMMUNITY): Payer: Self-pay

## 2018-03-02 ENCOUNTER — Other Ambulatory Visit (HOSPITAL_COMMUNITY)
Admission: RE | Admit: 2018-03-02 | Discharge: 2018-03-02 | Disposition: A | Discharge status: Home / Self Care | Payer: No Typology Code available for payment source | Source: Ambulatory Visit | Attending: Obstetrics and Gynecology | Admitting: Obstetrics and Gynecology

## 2018-03-02 ENCOUNTER — Encounter: Payer: Self-pay | Admitting: Obstetrics and Gynecology

## 2018-03-02 ENCOUNTER — Other Ambulatory Visit: Payer: Self-pay

## 2018-03-02 VITALS — BP 139/86 | HR 102 | Wt 248.6 lb

## 2018-03-02 DIAGNOSIS — O24919 Unspecified diabetes mellitus in pregnancy, unspecified trimester: Secondary | ICD-10-CM

## 2018-03-02 DIAGNOSIS — Z3490 Encounter for supervision of normal pregnancy, unspecified, unspecified trimester: Secondary | ICD-10-CM | POA: Diagnosis present

## 2018-03-02 DIAGNOSIS — Z98891 History of uterine scar from previous surgery: Secondary | ICD-10-CM

## 2018-03-02 DIAGNOSIS — IMO0002 Reserved for concepts with insufficient information to code with codable children: Secondary | ICD-10-CM

## 2018-03-02 DIAGNOSIS — O99210 Obesity complicating pregnancy, unspecified trimester: Secondary | ICD-10-CM | POA: Diagnosis present

## 2018-03-02 DIAGNOSIS — O99213 Obesity complicating pregnancy, third trimester: Secondary | ICD-10-CM | POA: Insufficient documentation

## 2018-03-02 DIAGNOSIS — O139 Gestational [pregnancy-induced] hypertension without significant proteinuria, unspecified trimester: Secondary | ICD-10-CM | POA: Insufficient documentation

## 2018-03-02 DIAGNOSIS — O26893 Other specified pregnancy related conditions, third trimester: Secondary | ICD-10-CM | POA: Diagnosis present

## 2018-03-02 DIAGNOSIS — O24414 Gestational diabetes mellitus in pregnancy, insulin controlled: Secondary | ICD-10-CM | POA: Diagnosis not present

## 2018-03-02 DIAGNOSIS — E1165 Type 2 diabetes mellitus with hyperglycemia: Secondary | ICD-10-CM | POA: Diagnosis present

## 2018-03-02 DIAGNOSIS — Z3A35 35 weeks gestation of pregnancy: Secondary | ICD-10-CM

## 2018-03-02 DIAGNOSIS — Z794 Long term (current) use of insulin: Principal | ICD-10-CM

## 2018-03-02 DIAGNOSIS — Z6791 Unspecified blood type, Rh negative: Secondary | ICD-10-CM

## 2018-03-02 DIAGNOSIS — O0990 Supervision of high risk pregnancy, unspecified, unspecified trimester: Secondary | ICD-10-CM

## 2018-03-02 DIAGNOSIS — O24313 Unspecified pre-existing diabetes mellitus in pregnancy, third trimester: Principal | ICD-10-CM

## 2018-03-02 DIAGNOSIS — O0993 Supervision of high risk pregnancy, unspecified, third trimester: Secondary | ICD-10-CM

## 2018-03-02 DIAGNOSIS — O289 Unspecified abnormal findings on antenatal screening of mother: Secondary | ICD-10-CM | POA: Insufficient documentation

## 2018-03-02 DIAGNOSIS — O99214 Obesity complicating childbirth: Secondary | ICD-10-CM | POA: Diagnosis present

## 2018-03-02 DIAGNOSIS — O26899 Other specified pregnancy related conditions, unspecified trimester: Secondary | ICD-10-CM

## 2018-03-02 DIAGNOSIS — O42913 Preterm premature rupture of membranes, unspecified as to length of time between rupture and onset of labor, third trimester: Secondary | ICD-10-CM | POA: Diagnosis present

## 2018-03-02 DIAGNOSIS — O24113 Pre-existing diabetes mellitus, type 2, in pregnancy, third trimester: Secondary | ICD-10-CM

## 2018-03-02 DIAGNOSIS — O133 Gestational [pregnancy-induced] hypertension without significant proteinuria, third trimester: Secondary | ICD-10-CM

## 2018-03-02 DIAGNOSIS — O1414 Severe pre-eclampsia complicating childbirth: Secondary | ICD-10-CM | POA: Diagnosis present

## 2018-03-02 DIAGNOSIS — IMO0001 Reserved for inherently not codable concepts without codable children: Secondary | ICD-10-CM

## 2018-03-02 DIAGNOSIS — O2412 Pre-existing diabetes mellitus, type 2, in childbirth: Principal | ICD-10-CM | POA: Diagnosis present

## 2018-03-02 LAB — CBC WITH DIFFERENTIAL/PLATELET
Basophils Absolute: 0 10*3/uL (ref 0.0–0.1)
Basophils Relative: 0 %
Eosinophils Absolute: 0 10*3/uL (ref 0.0–0.5)
Eosinophils Relative: 0 %
HCT: 44.1 % (ref 36.0–46.0)
Hemoglobin: 15 g/dL (ref 12.0–15.0)
Lymphocytes Relative: 26 %
Lymphs Abs: 1.6 10*3/uL (ref 0.7–4.0)
MCH: 30.2 pg (ref 26.0–34.0)
MCHC: 34 g/dL (ref 30.0–36.0)
MCV: 88.7 fL (ref 80.0–100.0)
Monocytes Absolute: 0.3 10*3/uL (ref 0.1–1.0)
Monocytes Relative: 4 %
Neutro Abs: 4.3 10*3/uL (ref 1.7–7.7)
Neutrophils Relative %: 70 %
Platelets: 217 10*3/uL (ref 150–400)
RBC: 4.97 MIL/uL (ref 3.87–5.11)
RDW: 13.5 % (ref 11.5–15.5)
WBC: 6.1 10*3/uL (ref 4.0–10.5)
nRBC: 0 % (ref 0.0–0.2)

## 2018-03-02 LAB — GLUCOSE, CAPILLARY
GLUCOSE-CAPILLARY: 235 mg/dL — AB (ref 70–99)
Glucose-Capillary: 223 mg/dL — ABNORMAL HIGH (ref 70–99)

## 2018-03-02 LAB — TYPE AND SCREEN
ABO/RH(D): B NEG
Antibody Screen: NEGATIVE

## 2018-03-02 LAB — ABO/RH: ABO/RH(D): B NEG

## 2018-03-02 IMAGING — US US MFM FETAL BPP W/ NON-STRESS
1 series · 14 of 14 positions shown · non-contrast
Comparison: none

[Series 1: us mfm fetal bpp w/ non-stress · 14 acquisitions, 14 frames shown]
[im 1/14]
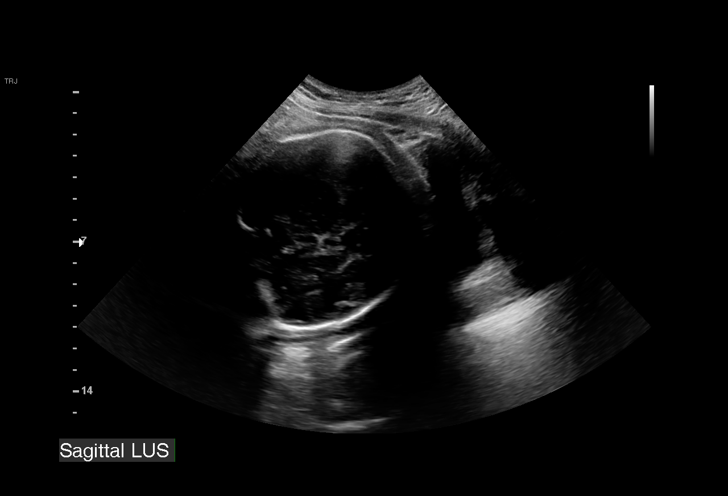
[im 2/14]
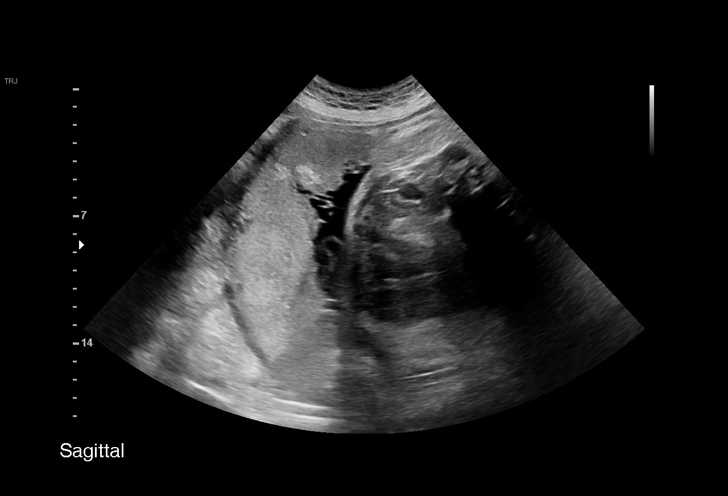
[im 3/14]
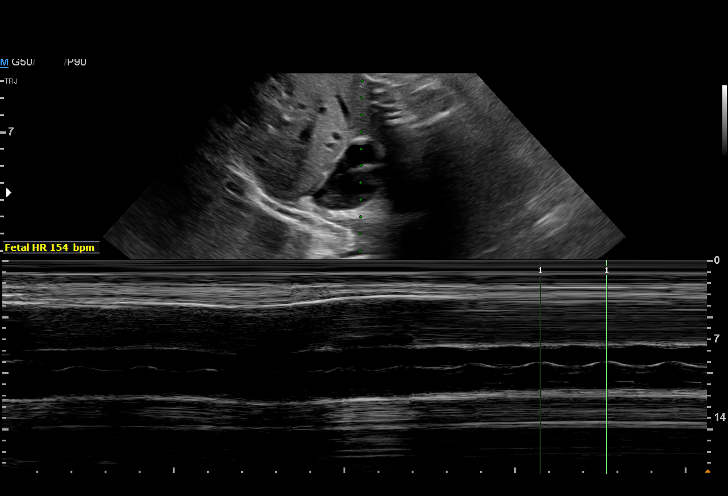
[im 4/14]
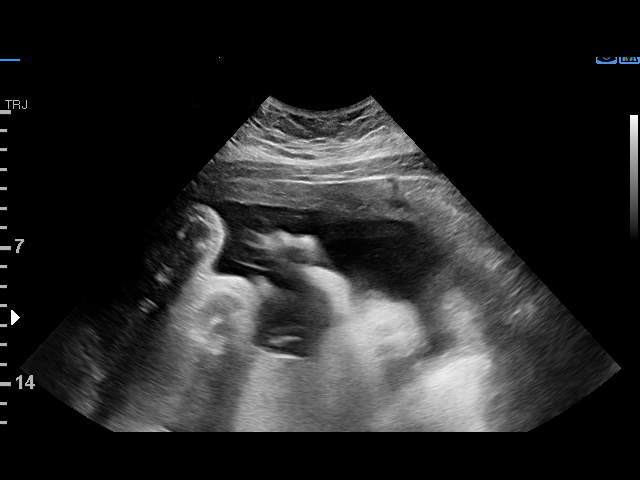
[im 5/14]
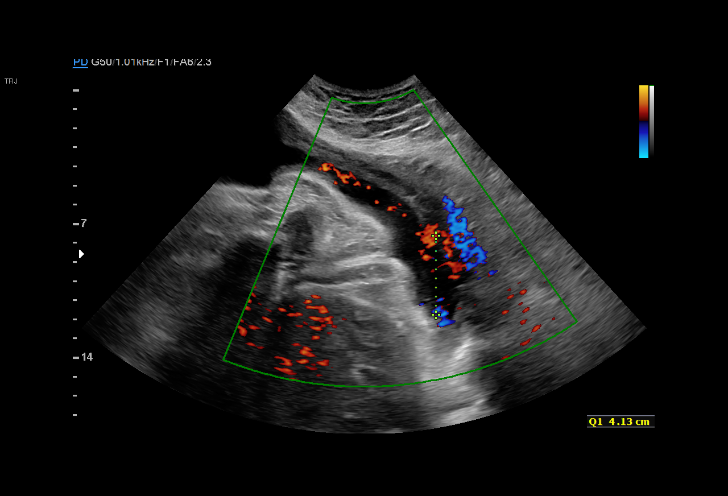
[im 6/14]
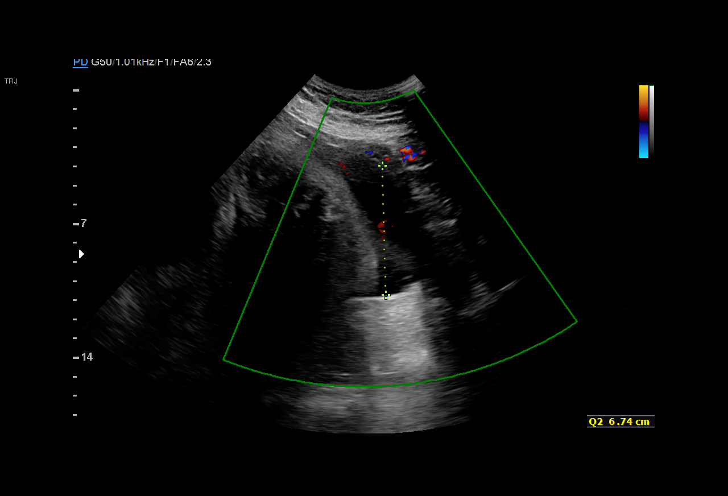
[im 7/14]
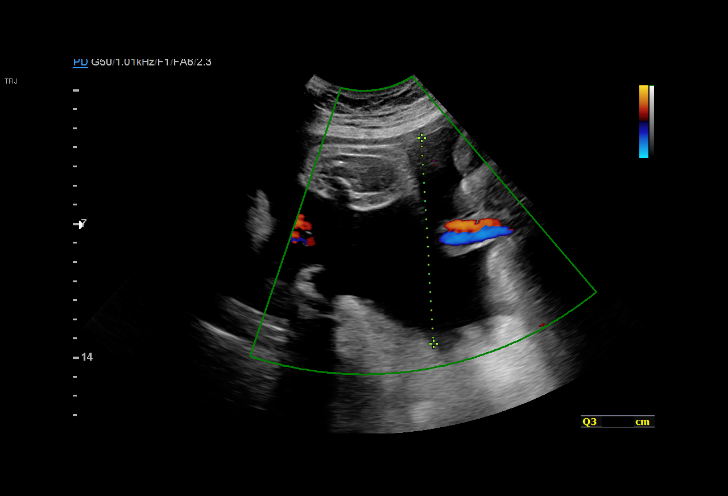
[im 8/14]
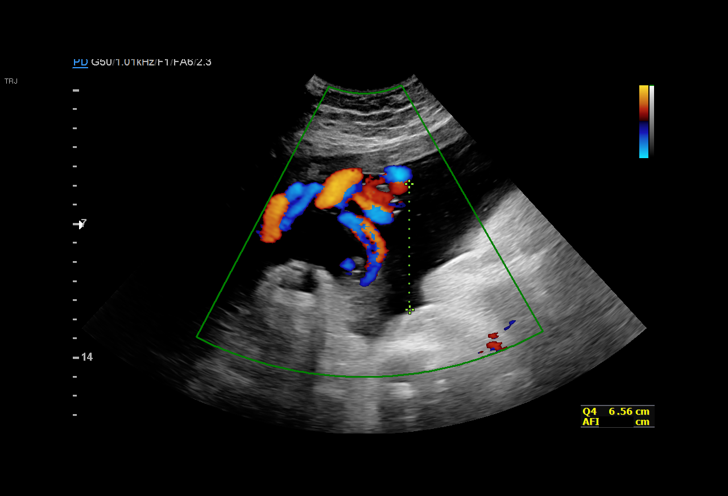
[im 9/14]
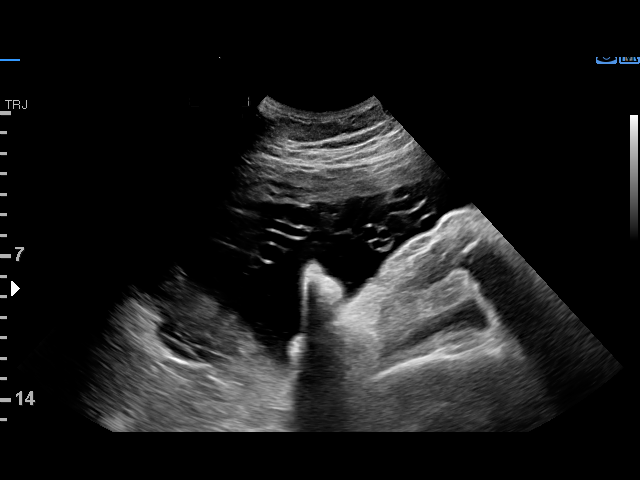
[im 10/14]
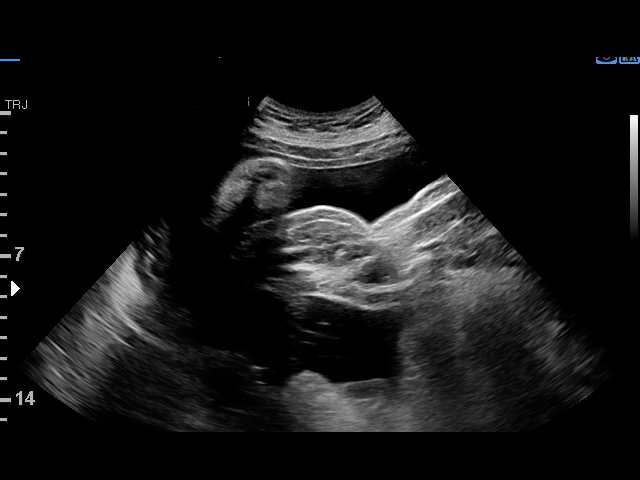
[im 11/14]
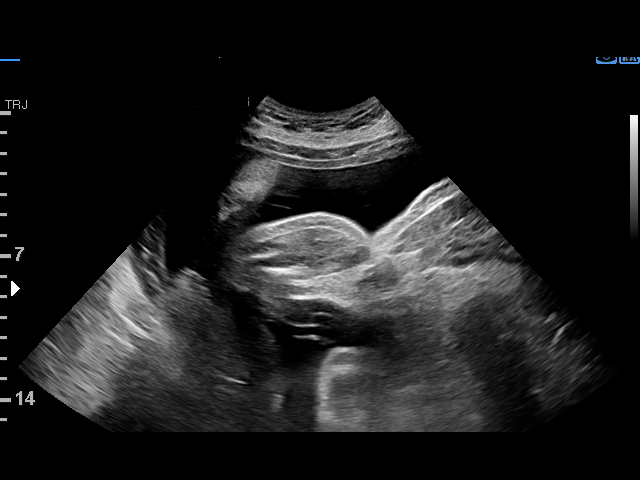
[im 12/14]
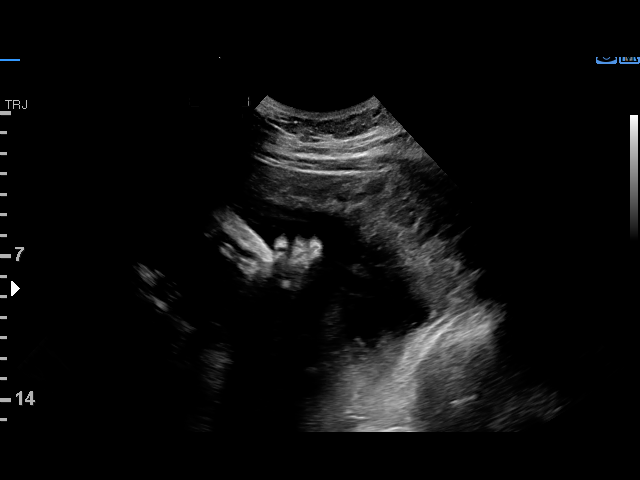
[im 13/14]
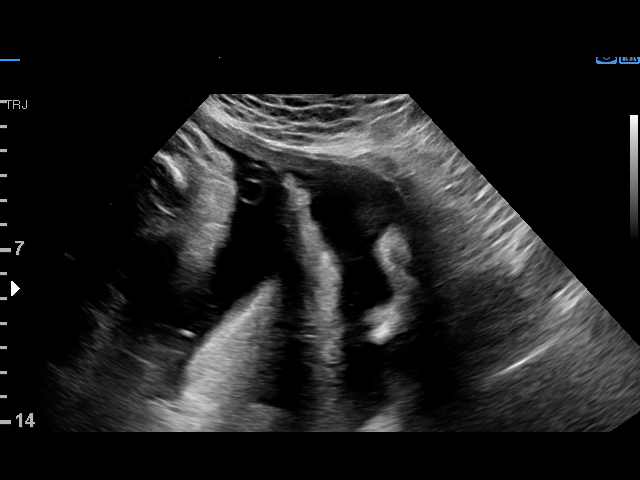
[im 14/14]
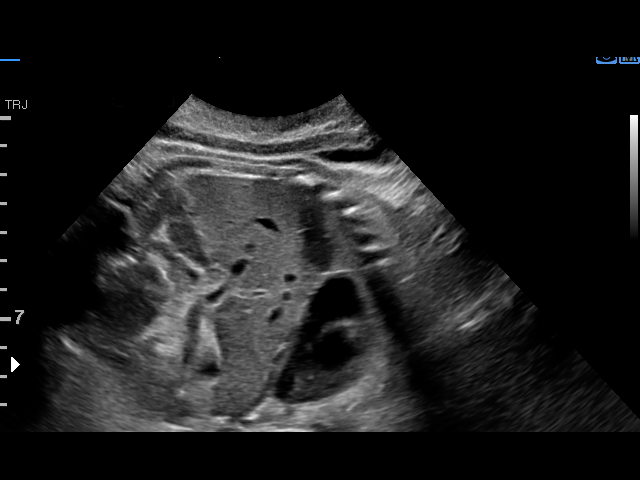

[14 of 14 positions shown; findings below may reference images not displayed]

[facility]/G[facility] Clinic
 Attending:        [facility]        Secondary Phy.:   [facility] Nursing-
                                                            MAU/Triage

     W/NONSTRESS                                       [facility]
 ----------------------------------------------------------------------

 ----------------------------------------------------------------------
Indications

  Pre-existing diabetes, type 2, in pregnancy,   [CD]
  third trimester (ECHO NML)
  Obesity complicating pregnancy, third          [CD]
  trimester (low risk NIPS)
  Hypertension - Gestational                     [CD]
  Non-reactive NST                               [CD]
  35 weeks gestation of pregnancy
 ----------------------------------------------------------------------
Vital Signs

                                                Height:        5'3"
Fetal Evaluation

 Num Of Fetuses:         1
 Fetal Heart Rate(bpm):  154
 Cardiac Activity:       Observed
 Presentation:           Cephalic
 Placenta:               Fundal

 Amniotic Fluid
 AFI FV:      Within normal limits

 AFI Sum(cm)     %Tile       Largest Pocket(cm)
 28.18           > 97
 RUQ(cm)       RLQ(cm)       LUQ(cm)        LLQ(cm)

Biophysical Evaluation

 Amniotic F.V:   Pocket => 2 cm two         F. Tone:        Observed
                 planes
 F. Movement:    Observed                   N.S.T:          Nonreactive
 F. Breathing:   Observed                   Score:          [DATE]
OB History

 Gravidity:    2          SAB:   1
 Living:       0
Gestational Age

 LMP:           37w 0d        Date:  [DATE]                 EDD:   [DATE]
 Best:          35w 2d     Det. By:  U/S  ([DATE])          EDD:   [DATE]
Impression

 Patient with type 2 diabetes is here for antenatal testing. NST
 performed at your office was not reactive. In my discussion
 with Dr. SHK learned that her diabetes is not well
 controlled and that she does not check her blood glucose
 regularly.

 On ultrasound, amniotic fluid is normal and good fetal activity
 is seen. Antenatal testing is reassuring. NST is not reactive.

 I emphasized the importance of good control of diabetes to
 prevent adverse fetal and neonatal outcomes. I informed her
 of the limitations of antenatal testing in predicting fetal
 compromise and that stillbirth can occur even if antenatal
 testing is reassuring.
 I strongly recommended inpatient management for good
 control of diabetes. Patient agreed with my recommendation.

Recommendations

 -Admit High-Risk Unit.
 -Twice-weekly BPP.
                 [facility]

## 2018-03-02 MED ORDER — ACETAMINOPHEN 325 MG PO TABS
650.0000 mg | ORAL_TABLET | ORAL | Status: DC | PRN
  Administered 2018-03-10 – 2018-03-12 (×3): 650 mg via ORAL
  Filled 2018-03-02 (×3): qty 2

## 2018-03-02 MED ORDER — INSULIN ASPART 100 UNIT/ML ~~LOC~~ SOLN: 0.0000 [IU] | Freq: Three times a day (TID) | SUBCUTANEOUS | Status: DC

## 2018-03-02 MED ORDER — SERTRALINE HCL 50 MG PO TABS
50.0000 mg | ORAL_TABLET | Freq: Every day | ORAL | Status: DC
  Administered 2018-03-02 – 2018-03-12 (×11): 50 mg via ORAL
  Filled 2018-03-02 (×14): qty 1

## 2018-03-02 MED ORDER — ASPIRIN EC 81 MG PO TBEC
81.0000 mg | DELAYED_RELEASE_TABLET | Freq: Every day | ORAL | Status: DC
  Administered 2018-03-02 – 2018-03-08 (×7): 81 mg via ORAL
  Filled 2018-03-02 (×9): qty 1

## 2018-03-02 MED ORDER — DOCUSATE SODIUM 100 MG PO CAPS
100.0000 mg | ORAL_CAPSULE | Freq: Every day | ORAL | Status: DC
  Administered 2018-03-04 – 2018-03-08 (×5): 100 mg via ORAL
  Filled 2018-03-02 (×5): qty 1

## 2018-03-02 MED ORDER — CALCIUM CARBONATE ANTACID 500 MG PO CHEW: 2.0000 | CHEWABLE_TABLET | ORAL | Status: DC | PRN

## 2018-03-02 MED ORDER — METFORMIN HCL ER 750 MG PO TB24
2000.0000 mg | ORAL_TABLET | Freq: Every day | ORAL | Status: DC
  Administered 2018-03-02 – 2018-03-08 (×7): 2000 mg via ORAL
  Filled 2018-03-02 (×7): qty 1

## 2018-03-02 MED ORDER — PRENATAL MULTIVITAMIN CH
1.0000 | ORAL_TABLET | Freq: Every day | ORAL | Status: DC
  Administered 2018-03-03 – 2018-03-12 (×9): 1 via ORAL
  Filled 2018-03-02 (×10): qty 1

## 2018-03-02 MED ORDER — INSULIN ASPART 100 UNIT/ML ~~LOC~~ SOLN
18.0000 [IU] | Freq: Three times a day (TID) | SUBCUTANEOUS | Status: DC
  Administered 2018-03-03 – 2018-03-08 (×12): 18 [IU] via SUBCUTANEOUS

## 2018-03-02 MED ORDER — LACTATED RINGERS IV SOLN
INTRAVENOUS | Status: DC
  Administered 2018-03-02 – 2018-03-03 (×2): via INTRAVENOUS

## 2018-03-02 MED ORDER — INSULIN ASPART 100 UNIT/ML ~~LOC~~ SOLN
0.0000 [IU] | Freq: Three times a day (TID) | SUBCUTANEOUS | Status: DC
  Administered 2018-03-02: 4 [IU] via SUBCUTANEOUS

## 2018-03-02 MED ORDER — INSULIN GLARGINE 100 UNIT/ML ~~LOC~~ SOLN
30.0000 [IU] | Freq: Two times a day (BID) | SUBCUTANEOUS | Status: DC
  Administered 2018-03-02 – 2018-03-08 (×13): 30 [IU] via SUBCUTANEOUS
  Filled 2018-03-02 (×15): qty 0.3

## 2018-03-02 MED ORDER — ZOLPIDEM TARTRATE 5 MG PO TABS: 5.0000 mg | ORAL_TABLET | Freq: Every evening | ORAL | Status: DC | PRN

## 2018-03-02 NOTE — Progress Notes (Signed)
   PRENATAL VISIT NOTE  Subjective:  Angela Barry is a 22 y.o. G2P0010 at 144w2d being seen today for ongoing prenatal care.  She is currently monitored for the following issues for this high-risk pregnancy and has Uncontrolled type 2 diabetes mellitus with hyperglycemia, without long-term current use of insulin (HCC); Supervision of high risk pregnancy, antepartum; Obesity during pregnancy with antepartum complication; Rh negative state in antepartum period; Vitamin D deficiency; and Gestational hypertension on their problem list.  Patient reports no complaints.  Contractions: Irritability. Vag. Bleeding: None.  Movement: Present. Denies leaking of fluid.   The following portions of the patient's history were reviewed and updated as appropriate: allergies, current medications, past family history, past medical history, past social history, past surgical history and problem list. Problem list updated.  Objective:   Vitals:   03/02/18 1412  BP: 139/86  Pulse: (!) 102  Weight: 248 lb 9.6 oz (112.8 kg)    Fetal Status: Fetal Heart Rate (bpm): NST   Movement: Present     General:  Alert, oriented and cooperative. Patient is in no acute distress.  Skin: Skin is warm and dry. No rash noted.   Cardiovascular: Normal heart rate noted  Respiratory: Normal respiratory effort, no problems with respiration noted  Abdomen: Soft, gravid, appropriate for gestational age.  Pain/Pressure: Present     Pelvic: Cervical exam deferred Dilation: Closed Effacement (%): Thick Station: Ballotable  Extremities: Normal range of motion.  Edema: None  Mental Status: Normal mood and affect. Normal behavior. Normal judgment and thought content.   Assessment and Plan:  Pregnancy: G2P0010 at 5544w2d  1. Supervision of high risk pregnancy, antepartum Patient is doing well without complaints Cultures today  2. Uncontrolled type 2 diabetes mellitus with hyperglycemia, without long-term current use of insulin  (HCC) Patient reports fasting in the 120's and postprandial in the 200 Patient has not been checking CBGs as instructed as she is in the process of moving. Patient seen by endocrinologist who has not made modifications to insulin dosage due to lack of CBG Discussed fetal impact of poorly controlled diabetes including but not limited to risk of IUFD, neonatal demise, neonatal hypoglycemia, seizure, extended NICU stay. NST reviewed and nonreactive with baseline 150, min variability, no accels, no decels Patient scheduled for BPP today  3. Gestational hypertension, third trimester Normotensive today without complaints Plan for IOL at 37 weeks  4. Obesity during pregnancy with antepartum complication Excessive weight gain again discussed  Preterm labor symptoms and general obstetric precautions including but not limited to vaginal bleeding, contractions, leaking of fluid and fetal movement were reviewed in detail with the patient. Please refer to After Visit Summary for other counseling recommendations.  Return in about 1 week (around 03/09/2018) for ROB, NST.  Future Appointments  Date Time Provider Department Center  03/09/2018  2:15 PM [facility] US 4 [facility]US [facility]-US  03/09/2018  3:15 PM WH-[facility] NST [facility]-US  03/14/2018  7:30 AM WH-BSSCHED ROOM WH-[facility]SS[facility]ED None  03/16/2018 12:45 PM [facility] US 2 [facility]US M[facility]-US  03/16/2018  2:15 PM WH-MFC NST [facility]-US    Catalina AntiguaPeggy Carroll Ranney, MD

## 2018-03-02 NOTE — Procedures (Signed)
Angela Barry 11/22/1995 3056w2d  Fetus A Non-Stress Test Interpretation for 03/02/18  Indication: T2DM-Insulin  Fetal Heart Rate A Mode: External Baseline Rate (A): 150 bpm Variability: Minimal Accelerations: 10 x 10(X1) Decelerations: (1 questionable late decel.) Multiple birth?: No  Uterine Activity Mode: Palpation, Toco Contraction Frequency (min): 1 uc Contraction Quality: Mild Resting Tone Palpated: Relaxed Resting Time: Adequate  Interpretation (Fetal Testing) Nonstress Test Interpretation: Non-reactive Comments: EFM tracing reviewed by Dr. Judeth CornfieldShankar

## 2018-03-02 NOTE — ED Notes (Signed)
NST in progress.

## 2018-03-02 NOTE — Progress Notes (Signed)
Pt is here for ROB. G2P0010 5334w2d. NST today

## 2018-03-02 NOTE — H&P (Signed)
Obstetric History and Physical  Audrey Berroa is a 22 y.o. G2P0010 with IUP at [redacted]w[redacted]d presenting for insulin management in setting of poorly controlled T2DM. Patient states she has been having no contractions. Reports normal fetal movement. Denies leaking of fluid, denies vaginal bleeding.     Prenatal Course  Pregnancy complications or risks: Patient Active Problem List   Diagnosis Date Noted  . Gestational hypertension 02/05/2018  . Rh negative state in antepartum period 10/07/2017  . Vitamin D deficiency 10/07/2017  . Supervision of high risk pregnancy, antepartum 09/25/2017  . Obesity during pregnancy with antepartum complication 09/25/2017  . Uncontrolled type 2 diabetes mellitus with hyperglycemia, without long-term current use of insulin (HCC) 09/05/2017   Medical History:  Past Medical History:  Diagnosis Date  . Diabetes mellitus without complication [facility])     Past Surgical History:  Procedure Laterality Date  . NO PAST SURGERIES      OB History  Gravida Para Term Preterm AB Living  2       1    SAB TAB Ectopic Multiple Live Births  1            # Outcome Date GA Lbr Len/2nd Weight Sex Delivery Anes PTL Lv  2 Current           1 SAB  [redacted]w[redacted]d           Social History   Socioeconomic History  . Marital status: Single    Spouse name: Not on file  . Number of children: Not on file  . Years of education: Not on file  . Highest education level: Not on file  Occupational History  . Not on file  Social Needs  . Financial resource strain: Not on file  . Food insecurity:    Worry: Not on file    Inability: Not on file  . Transportation needs:    Medical: Not on file    Non-medical: Not on file  Tobacco Use  . Smoking status: Never Smoker  . Smokeless tobacco: Never Used  Substance and Sexual Activity  . Alcohol use: Not Currently  . Drug use: Never  . Sexual activity: Yes    Partners: Male  Lifestyle  . Physical activity:    Days per week: Not on  file    Minutes per session: Not on file  . Stress: Not on file  Relationships  . Social connections:    Talks on phone: Not on file    Gets together: Not on file    Attends religious service: Not on file    Active member of club or organization: Not on file    Attends meetings of clubs or organizations: Not on file    Relationship status: Not on file  Other Topics Concern  . Not on file  Social History Narrative  . Not on file    Family History  Problem Relation Age of Onset  . Diabetes Father   . Diabetes Paternal Grandmother   . Diabetes Paternal Grandfather   . Hypertension Neg Hx     Facility-Administered Medications Prior to Admission  Medication Dose Route Frequency Provider Last Rate Last Dose  . Insulin Pen Needle (NOVOFINE) 10 each  1 packet Subcutaneous PRN Denney, Rachelle A, CNM       Medications Prior to Admission  Medication Sig Dispense Refill Last Dose  . aspirin EC 81 MG tablet Take 1 tablet (81 mg total) by mouth daily. Take after 12 weeks for prevention of  preeclampsia later in pregnancy 300 tablet 2 Taking  . glucose blood (ACCU-CHEK COMPACT TEST DRUM) test strip Use as instructed to check blood sugar 3 times daily. 100 each 3 Taking  . glucose blood (ACCU-CHEK GUIDE) test strip 1 each by Other route as needed for other. Use as instructed to check blood sugar 4 times daily 200 each 3 Taking  . insulin aspart (NOVOLOG) 100 UNIT/ML injection Inject 18 Units into the skin 3 (three) times daily with meals. 10 mL 12 Taking  . Insulin Glargine (BASAGLAR KWIKPEN) 100 UNIT/ML SOPN Inject 0.3 mLs (30 Units total) into the skin 2 (two) times daily. 45 mL 1 Taking  . Insulin Pen Needle 31G X 4 MM MISC 1 Package by Does not apply route 3 (three) times daily before meals. 90 each 3 Taking  . Insulin Syringe-Needle U-100 (BD INSULIN SYRINGE) 29G X 1/2" 0.3 ML MISC Use  To inject into skin 3 times daily before meals 100 each 12 Taking  . Lancets MISC Use to check blood  sugar as instructed. 200 each 3 Taking  . metFORMIN (GLUCOPHAGE-XR) 500 MG 24 hr tablet TAKE 4 TABLETS (2,000 MG TOTAL) BY MOUTH DAILY WITH SUPPER. 360 tablet 1 Taking  . Prenatal Vit w/Fe-Methylfol-FA (PNV PO) Take 1 tablet by mouth.   Taking  . sertraline (ZOLOFT) 50 MG tablet Take 1 tablet (50 mg total) by mouth daily. 30 tablet 3 Taking  . Vitamin D, Ergocalciferol, (DRISDOL) 50000 units CAPS capsule Take 1 capsule (50,000 Units total) by mouth every 7 (seven) days. 30 capsule 2 Taking    No Known Allergies  Review of Systems: Negative except for what is mentioned in HPI.  Physical Exam: LMP 06/16/2017  CONSTITUTIONAL: Well-developed, well-nourished female in no acute distress.  HENT:  Normocephalic, atraumatic, External right and left ear normal. Oropharynx is clear and moist EYES: Conjunctivae and EOM are normal. Pupils are equal, round, and reactive to light. No scleral icterus.  NECK: Normal range of motion, supple, no masses SKIN: Skin is warm and dry. No rash noted. Not diaphoretic. No erythema. No pallor. NEUROLOGIC: Alert and oriented to person, place, and time. Normal reflexes, muscle tone coordination. No cranial nerve deficit noted. PSYCHIATRIC: Normal mood and affect. Normal behavior. Normal judgment and thought content. CARDIOVASCULAR: Normal heart rate noted, regular rhythm RESPIRATORY: Effort and breath sounds normal, no problems with respiration noted ABDOMEN: Soft, nontender, nondistended, gravid. MUSCULOSKELETAL: Normal range of motion. No edema and no tenderness. 2+ distal pulses.   Pertinent Labs/Studies:   No results found for this or any previous visit (from the past 24 hour(s)).  Assessment : Court Joynthonia Waterworth is a 22 y.o. G2P0010 at 981w2d being admitted for insulin management in the setting of T2DM. Non-reactive NST today with BPP 8/8. Recommended for admission for insulin management with poor compliance.  Plan:  T2DM - insulin home regimen ordered - CBG  Fasting and TID AC - will titrate as needed  FWB - daily NST    K. Therese SarahMeryl Davis, M.D. Attending Center for [facility] (Faculty Practice)  03/02/2018, 5:18 PM

## 2018-03-03 DIAGNOSIS — O24414 Gestational diabetes mellitus in pregnancy, insulin controlled: Secondary | ICD-10-CM | POA: Diagnosis not present

## 2018-03-03 LAB — CERVICOVAGINAL ANCILLARY ONLY
Chlamydia: NEGATIVE
Neisseria Gonorrhea: NEGATIVE

## 2018-03-03 LAB — GLUCOSE, CAPILLARY
Glucose-Capillary: 109 mg/dL — ABNORMAL HIGH (ref 70–99)
Glucose-Capillary: 127 mg/dL — ABNORMAL HIGH (ref 70–99)
Glucose-Capillary: 153 mg/dL — ABNORMAL HIGH (ref 70–99)
Glucose-Capillary: 99 mg/dL (ref 70–99)
Glucose-Capillary: 124 mg/dL — ABNORMAL HIGH (ref 70–99)

## 2018-03-03 NOTE — Progress Notes (Signed)
FACULTY PRACTICE ANTEPARTUM PROGRESS NOTE  Court Joynthonia Vanhecke is a 22 y.o. G2P0010 at 1325w3d who is admitted for poorly controlled T2DM.  Estimated Date of Delivery: 04/04/18 Fetal presentation is cephalic.  Length of Stay:  1 Days. Admitted 03/02/2018  Subjective:  Patient reports normal fetal movement.  She denies uterine contractions, denies bleeding and leaking of fluid per vagina.  Vitals:  Blood pressure 133/77, pulse 81, temperature 97.9 F (36.6 C), temperature source Oral, resp. rate 16, height 5\' 2"  (1.575 m), weight 111.6 kg, last menstrual period 06/16/2017, SpO2 97 %. Physical Examination: CONSTITUTIONAL: Well-developed, well-nourished female in no acute distress.  HENT:  Normocephalic, atraumatic, External right and left ear normal. Oropharynx is clear and moist EYES: Conjunctivae and EOM are normal. Pupils are equal, round, and reactive to light. No scleral icterus.  NECK: Normal range of motion, supple, no masses. SKIN: Skin is warm and dry. No rash noted. Not diaphoretic. No erythema. No pallor. NEUROLGIC: Alert and oriented to person, place, and time. Normal reflexes, muscle tone coordination. No cranial nerve deficit noted. PSYCHIATRIC: Normal mood and affect. Normal behavior. Normal judgment and thought content. CARDIOVASCULAR: Normal heart rate noted, regular rhythm RESPIRATORY: Effort and breath sounds normal, no problems with respiration noted MUSCULOSKELETAL: Normal range of motion. No edema and no tenderness. ABDOMEN: Soft, nontender, nondistended, gravid. CERVIX: deferred  Fetal monitoring: FHR: 150 bpm, Variability: moderate, Accelerations: Present, Decelerations: Absent  Uterine activity: occasional contractions   Current scheduled medications . aspirin EC  81 mg Oral Daily  . docusate sodium  100 mg Oral Daily  . insulin aspart  0-14 Units Subcutaneous TID PC  . insulin aspart  18 Units Subcutaneous TID WC  . insulin glargine  30 Units Subcutaneous BID  .  metFORMIN  2,000 mg Oral Q supper  . prenatal multivitamin  1 tablet Oral Q1200  . sertraline  50 mg Oral Daily    I have reviewed the patient's current medications.  ASSESSMENT: Active Problems:   Supervision of high risk pregnancy, antepartum   Obesity during pregnancy with antepartum complication   Rh negative state in antepartum period   Gestational hypertension   Uncontrolled diabetes mellitus (HCC)   PLAN: Cont home meds Currently lantus 30 units BID novolog 18 units TID with meals SSI as needed, will titrate as needed Next BPP Thursday BP stable For IOL 37 weeks   Continue routine antenatal care.   Baldemar LenisK. Meryl Gennavieve Huq, M.D. Attending Center for [facility] (Faculty Practice)  03/03/2018 8:18 AM

## 2018-03-04 ENCOUNTER — Observation Stay (HOSPITAL_BASED_OUTPATIENT_CLINIC_OR_DEPARTMENT_OTHER): Payer: No Typology Code available for payment source

## 2018-03-04 DIAGNOSIS — O289 Unspecified abnormal findings on antenatal screening of mother: Secondary | ICD-10-CM

## 2018-03-04 DIAGNOSIS — Z3A35 35 weeks gestation of pregnancy: Secondary | ICD-10-CM | POA: Diagnosis not present

## 2018-03-04 DIAGNOSIS — O24414 Gestational diabetes mellitus in pregnancy, insulin controlled: Secondary | ICD-10-CM | POA: Diagnosis not present

## 2018-03-04 LAB — GLUCOSE, CAPILLARY
Glucose-Capillary: 100 mg/dL — ABNORMAL HIGH (ref 70–99)
Glucose-Capillary: 109 mg/dL — ABNORMAL HIGH (ref 70–99)
Glucose-Capillary: 125 mg/dL — ABNORMAL HIGH (ref 70–99)
Glucose-Capillary: 65 mg/dL — ABNORMAL LOW (ref 70–99)

## 2018-03-04 LAB — STREP GP B NAA: Strep Gp B NAA: NEGATIVE

## 2018-03-04 MED ORDER — LACTATED RINGERS IV BOLUS: 1000.0000 mL | Freq: Once | INTRAVENOUS | Status: AC

## 2018-03-04 MED ORDER — LACTATED RINGERS IV BOLUS
1000.0000 mL | Freq: Once | INTRAVENOUS | Status: AC
  Administered 2018-03-04: 1000 mL via INTRAVENOUS

## 2018-03-04 NOTE — Progress Notes (Signed)
Initial Nutrition Assessment  DOCUMENTATION CODES:  Morbid obesity  INTERVENTION:  CHO modified gestational diabetic diet NUTRITION DIAGNOSIS:  Increased nutrient needs related to (pregnancy and fetal growth requirements) as evidenced by (35 weeks IUP). GOAL:  Patient will meet greater than or equal to 90% of their needs MONITOR:  Labs REASON FOR ASSESSMENT:  Antenatal, Gestational Diabetes  ASSESSMENT:  35 4/7 weeks IUP, adm for DM control. Insulin/metformin. Followed by Dr Valora PiccoloKumar/Endo. pre-preg weight 223 lbs, BMI 40.9.  overall 22 lb weight gain  Diet Order:   Diet Order            Diet gestational carb mod Room service appropriate? Yes; Fluid consistency: Thin  Diet effective now             EDUCATION NEEDS:  Education needs have been addressed(outpt education 6/21, 10/22/17) Skin:  Skin Assessment: Reviewed RN Assessment Height:   Ht Readings from Last 1 Encounters:  03/02/18 5\' 2"  (1.575 m)  Weight:   Wt Readings from Last 1 Encounters:  03/02/18 111.6 kg   Ideal Body Weight:   110 lbs BMI:  Body mass index is 44.99 kg/m. Estimated Nutritional Needs:  Kcal:  2100-2300 Protein:  95-105 g Fluid:  2.4 L    Elisabeth CaraKatherine Myana Schlup M.Odis LusterEd. R.D. LDN Neonatal Nutrition Support Specialist/RD III Pager [phone]      Phone [phone]

## 2018-03-04 NOTE — Progress Notes (Signed)
FACULTY PRACTICE ANTEPARTUM PROGRESS NOTE  Angela Barry is a 22 y.o. G2P0010 at 9228w4d who is admitted for uncontrolled T2DM.  Estimated Date of Delivery: 04/04/18 Fetal presentation is cephalic.  Length of Stay:  1 Days. Admitted 03/02/2018  Subjective:  Patient reports normal fetal movement.  She denies uterine contractions, denies bleeding and leaking of fluid per vagina.  Vitals:  Blood pressure 127/75, pulse 92, temperature 98.6 F (37 C), temperature source Oral, resp. rate 20, height 5\' 2"  (1.575 m), weight 111.6 kg, last menstrual period 06/16/2017, SpO2 98 %. Physical Examination: CONSTITUTIONAL: Well-developed, well-nourished female in no acute distress.  HENT:  Normocephalic, atraumatic, External right and left ear normal. Oropharynx is clear and moist EYES: Conjunctivae and EOM are normal. Pupils are equal, round, and reactive to light. No scleral icterus.  NECK: Normal range of motion, supple, no masses. SKIN: Skin is warm and dry. No rash noted. Not diaphoretic. No erythema. No pallor. NEUROLGIC: Alert and oriented to person, place, and time. Normal reflexes, muscle tone coordination. No cranial nerve deficit noted. PSYCHIATRIC: Normal mood and affect. Normal behavior. Normal judgment and thought content. CARDIOVASCULAR: Normal heart rate noted, regular rhythm RESPIRATORY: Effort and breath sounds normal, no problems with respiration noted MUSCULOSKELETAL: Normal range of motion. No edema and no tenderness. ABDOMEN: Soft, nontender, nondistended, gravid. CERVIX: deferred  Fetal monitoring: FHR: 140s bpm, Variability: minimal to moderate, Accelerations: present, Decelerations: occasional late  Uterine activity: occasional contractions per hour  Results for orders placed or performed during the hospital encounter of 03/02/18 (from the past 48 hour(s))  Type and screen [facility]      Status: None   Collection Time: 03/02/18  5:56 PM  Result Value Ref  Range   ABO/RH(D) B NEG    Antibody Screen NEG    Sample Expiration      03/05/2018 Performed at [facility], [address] [place], [place] [address]   CBC with Differential/Platelet     Status: None   Collection Time: 03/02/18  5:56 PM  Result Value Ref Range   WBC 6.1 4.0 - 10.5 K/uL   RBC 4.97 3.87 - 5.11 MIL/uL   Hemoglobin 15.0 12.0 - 15.0 g/dL   HCT 60.444.1 54.036.0 - 98.146.0 %   MCV 88.7 80.0 - 100.0 fL   MCH 30.2 26.0 - 34.0 pg   MCHC 34.0 30.0 - 36.0 g/dL   RDW 19.113.5 47.811.5 - 29.515.5 %   Platelets 217 150 - 400 K/uL   nRBC 0.0 0.0 - 0.2 %   Neutrophils Relative % 70 %   Neutro Abs 4.3 1.7 - 7.7 K/uL   Lymphocytes Relative 26 %   Lymphs Abs 1.6 0.7 - 4.0 K/uL   Monocytes Relative 4 %   Monocytes Absolute 0.3 0.1 - 1.0 K/uL   Eosinophils Relative 0 %   Eosinophils Absolute 0.0 0.0 - 0.5 K/uL   Basophils Relative 0 %   Basophils Absolute 0.0 0.0 - 0.1 K/uL    Comment: Performed at [facility], [address] [place], [place] [address]  ABO/Rh     Status: None   Collection Time: 03/02/18  5:56 PM  Result Value Ref Range   ABO/RH(D)      B NEG Performed at [facility], [address] [place], [place] [address]   Glucose, capillary     Status: Abnormal   Collection Time: 03/02/18  8:51 PM  Result Value Ref Range   Glucose-Capillary 235 (H) 70 - 99 mg/dL  Glucose,  capillary     Status: Abnormal   Collection Time: 03/02/18  9:32 PM  Result Value Ref Range   Glucose-Capillary 223 (H) 70 - 99 mg/dL  Glucose, capillary     Status: Abnormal   Collection Time: 03/03/18  8:29 AM  Result Value Ref Range   Glucose-Capillary 109 (H) 70 - 99 mg/dL  Glucose, capillary     Status: Abnormal   Collection Time: 03/03/18 11:30 AM  Result Value Ref Range   Glucose-Capillary 127 (H) 70 - 99 mg/dL   Comment 1 Notify RN   Glucose, capillary     Status: Abnormal   Collection Time: 03/03/18  4:40 PM  Result Value Ref Range   Glucose-Capillary 153 (H) 70 - 99 mg/dL   Comment  1 Notify RN    Comment 2 Document in Chart   Glucose, capillary     Status: None   Collection Time: 03/03/18  8:11 PM  Result Value Ref Range   Glucose-Capillary 99 70 - 99 mg/dL  Glucose, capillary     Status: Abnormal   Collection Time: 03/03/18 10:40 PM  Result Value Ref Range   Glucose-Capillary 124 (H) 70 - 99 mg/dL     Current scheduled medications . aspirin EC  81 mg Oral Daily  . docusate sodium  100 mg Oral Daily  . insulin aspart  0-14 Units Subcutaneous TID PC  . insulin aspart  18 Units Subcutaneous TID WC  . insulin glargine  30 Units Subcutaneous BID  . metFORMIN  2,000 mg Oral Q supper  . prenatal multivitamin  1 tablet Oral Q1200  . sertraline  50 mg Oral Daily    I have reviewed the patient's current medications.  ASSESSMENT: Active Problems:   Supervision of high risk pregnancy, antepartum   Obesity during pregnancy with antepartum complication   Rh negative state in antepartum period   Gestational hypertension   Uncontrolled diabetes mellitus (HCC)   PLAN: Reviewed non-reassuring fetal tracing with MFM, who recommended stat BPP, which has been ordered Cont home meds Currently lantus 30 units BID novolog 18 units TID with meals SSI as needed, will titrate as needed BP stable For IOL 37 weeks   Continue routine antenatal care.   Baldemar Lenis, M.D. Attending Center for [employer] (Faculty Practice)  03/04/2018 11:40 AM

## 2018-03-04 NOTE — Progress Notes (Signed)
FHT now with moderate variability, no accels and no decels. BPP 8/8. Will keep on continuous monitoring overnight.    Baldemar LenisK. Meryl Susann Lawhorne, M.D. Attending Center for [facility](Faculty Practice)

## 2018-03-05 DIAGNOSIS — O139 Gestational [pregnancy-induced] hypertension without significant proteinuria, unspecified trimester: Secondary | ICD-10-CM | POA: Diagnosis not present

## 2018-03-05 DIAGNOSIS — O24419 Gestational diabetes mellitus in pregnancy, unspecified control: Secondary | ICD-10-CM | POA: Diagnosis not present

## 2018-03-05 DIAGNOSIS — O24414 Gestational diabetes mellitus in pregnancy, insulin controlled: Secondary | ICD-10-CM | POA: Diagnosis not present

## 2018-03-05 DIAGNOSIS — O1414 Severe pre-eclampsia complicating childbirth: Secondary | ICD-10-CM | POA: Diagnosis present

## 2018-03-05 DIAGNOSIS — Z6791 Unspecified blood type, Rh negative: Secondary | ICD-10-CM | POA: Diagnosis not present

## 2018-03-05 DIAGNOSIS — O99214 Obesity complicating childbirth: Secondary | ICD-10-CM | POA: Diagnosis present

## 2018-03-05 DIAGNOSIS — Z3A35 35 weeks gestation of pregnancy: Secondary | ICD-10-CM | POA: Diagnosis not present

## 2018-03-05 DIAGNOSIS — E1165 Type 2 diabetes mellitus with hyperglycemia: Secondary | ICD-10-CM | POA: Diagnosis present

## 2018-03-05 DIAGNOSIS — O42913 Preterm premature rupture of membranes, unspecified as to length of time between rupture and onset of labor, third trimester: Secondary | ICD-10-CM | POA: Diagnosis present

## 2018-03-05 DIAGNOSIS — Z3A36 36 weeks gestation of pregnancy: Secondary | ICD-10-CM | POA: Diagnosis not present

## 2018-03-05 DIAGNOSIS — O2412 Pre-existing diabetes mellitus, type 2, in childbirth: Secondary | ICD-10-CM | POA: Diagnosis present

## 2018-03-05 DIAGNOSIS — Z794 Long term (current) use of insulin: Secondary | ICD-10-CM | POA: Diagnosis not present

## 2018-03-05 DIAGNOSIS — O24113 Pre-existing diabetes mellitus, type 2, in pregnancy, third trimester: Secondary | ICD-10-CM | POA: Diagnosis not present

## 2018-03-05 DIAGNOSIS — O26893 Other specified pregnancy related conditions, third trimester: Secondary | ICD-10-CM | POA: Diagnosis present

## 2018-03-05 DIAGNOSIS — O99213 Obesity complicating pregnancy, third trimester: Secondary | ICD-10-CM | POA: Diagnosis not present

## 2018-03-05 LAB — TYPE AND SCREEN
ABO/RH(D): B NEG
Antibody Screen: NEGATIVE

## 2018-03-05 LAB — GLUCOSE, CAPILLARY
Glucose-Capillary: 73 mg/dL (ref 70–99)
Glucose-Capillary: 98 mg/dL (ref 70–99)
Glucose-Capillary: 86 mg/dL (ref 70–99)
Glucose-Capillary: 65 mg/dL — ABNORMAL LOW (ref 70–99)
Glucose-Capillary: 130 mg/dL — ABNORMAL HIGH (ref 70–99)

## 2018-03-05 NOTE — Progress Notes (Signed)
FACULTY PRACTICE ANTEPARTUM PROGRESS NOTE  Angela Barry is a 22 y.o. G2P0010 at 4650w5d who is admitted for poorly controlled Type 2 DM.  Estimated Date of Delivery: 04/04/18 Fetal presentation is cephalic.  Length of Stay:  1 Days. Admitted 03/02/2018  Subjective:  Patient reports normal fetal movement.  She denies uterine contractions, denies bleeding and leaking of fluid per vagina.  Vitals:  Blood pressure (!) 137/92, pulse 94, temperature 98.4 F (36.9 C), temperature source Oral, resp. rate 20, height 5\' 2"  (1.575 m), weight 111.6 kg, last menstrual period 06/16/2017, SpO2 99 %. Physical Examination: CONSTITUTIONAL: Well-developed, well-nourished female in no acute distress.  HENT:  Normocephalic, atraumatic, External right and left ear normal. Oropharynx is clear and moist EYES: Conjunctivae and EOM are normal. Pupils are equal, round, and reactive to light. No scleral icterus.  NECK: Normal range of motion, supple, no masses. SKIN: Skin is warm and dry. No rash noted. Not diaphoretic. No erythema. No pallor. NEUROLGIC: Alert and oriented to person, place, and time. Normal reflexes, muscle tone coordination. No cranial nerve deficit noted. PSYCHIATRIC: Normal mood and affect. Normal behavior. Normal judgment and thought content. CARDIOVASCULAR: Normal heart rate noted, regular rhythm RESPIRATORY: Effort and breath sounds normal, no problems with respiration noted MUSCULOSKELETAL: Normal range of motion. No edema and no tenderness. ABDOMEN: Soft, nontender, nondistended, gravid. CERVIX: deferred  Fetal monitoring: FHR: 150 bpm, Variability: moderate, Accelerations: Present, Decelerations: Absent  Uterine activity: no contractions per hour  Results for orders placed or performed during the hospital encounter of 03/02/18 (from the past 48 hour(s))  Glucose, capillary     Status: Abnormal   Collection Time: 03/03/18 11:30 AM  Result Value Ref Range   Glucose-Capillary 127 (H) 70 -  99 mg/dL   Comment 1 Notify RN   Glucose, capillary     Status: Abnormal   Collection Time: 03/03/18  4:40 PM  Result Value Ref Range   Glucose-Capillary 153 (H) 70 - 99 mg/dL   Comment 1 Notify RN    Comment 2 Document in Chart   Glucose, capillary     Status: None   Collection Time: 03/03/18  8:11 PM  Result Value Ref Range   Glucose-Capillary 99 70 - 99 mg/dL  Glucose, capillary     Status: Abnormal   Collection Time: 03/03/18 10:40 PM  Result Value Ref Range   Glucose-Capillary 124 (H) 70 - 99 mg/dL  Glucose, capillary     Status: Abnormal   Collection Time: 03/04/18 12:22 PM  Result Value Ref Range   Glucose-Capillary 100 (H) 70 - 99 mg/dL  Glucose, capillary     Status: Abnormal   Collection Time: 03/04/18  6:27 PM  Result Value Ref Range   Glucose-Capillary 109 (H) 70 - 99 mg/dL  Glucose, capillary     Status: Abnormal   Collection Time: 03/04/18  9:41 PM  Result Value Ref Range   Glucose-Capillary 65 (L) 70 - 99 mg/dL  Glucose, capillary     Status: Abnormal   Collection Time: 03/04/18 11:38 PM  Result Value Ref Range   Glucose-Capillary 125 (H) 70 - 99 mg/dL  Type and screen [facility] [place]     Status: None (Preliminary result)   Collection Time: 03/05/18  8:51 AM  Result Value Ref Range   ABO/RH(D) B NEG    Antibody Screen PENDING    Sample Expiration      03/08/2018 Performed at [facility], [address] [place], [place] [address]   Glucose,  capillary     Status: None   Collection Time: 03/05/18  9:06 AM  Result Value Ref Range   Glucose-Capillary 73 70 - 99 mg/dL     Current scheduled medications . aspirin EC  81 mg Oral Daily  . docusate sodium  100 mg Oral Daily  . insulin aspart  0-14 Units Subcutaneous TID PC  . insulin aspart  18 Units Subcutaneous TID WC  . insulin glargine  30 Units Subcutaneous BID  . metFORMIN  2,000 mg Oral Q supper  . prenatal multivitamin  1 tablet Oral Q1200  . sertraline  50 mg Oral Daily    I  have reviewed the patient's current medications.  ASSESSMENT: Active Problems:   Supervision of high risk pregnancy, antepartum   Obesity during pregnancy with antepartum complication   Rh negative state in antepartum period   Gestational hypertension   Uncontrolled diabetes mellitus (HCC)   PLAN: FHT reactive this am Cont home meds lantus 30 units BID novolog 18 units TID with meals SSIas needed, will titrate as needed BP stable For IOL 37 weeks   Continue routine antenatal care.   Baldemar Lenis, M.D. Attending Center for [employer] (Faculty Practice)  03/05/2018 10:59 AM

## 2018-03-06 LAB — GLUCOSE, CAPILLARY
GLUCOSE-CAPILLARY: 97 mg/dL (ref 70–99)
Glucose-Capillary: 69 mg/dL — ABNORMAL LOW (ref 70–99)
Glucose-Capillary: 69 mg/dL — ABNORMAL LOW (ref 70–99)
Glucose-Capillary: 127 mg/dL — ABNORMAL HIGH (ref 70–99)
Glucose-Capillary: 114 mg/dL — ABNORMAL HIGH (ref 70–99)

## 2018-03-06 NOTE — Progress Notes (Signed)
Inpatient Diabetes Program Recommendations  AACE/ADA: New Consensus Statement on Inpatient Glycemic Control (2015)  Target Ranges:  Prepandial:   less than 140 mg/dL      Peak postprandial:   less than 180 mg/dL (1-2 hours)      Critically ill patients:  140 - 180 mg/dL   Lab Results  Component Value Date   GLUCAP 69 (L) 03/06/2018   HGBA1C 7.8 (H) 12/19/2017    Review of Glycemic Control Results for [name], Nitara (MRN [id number]) as of 03/06/2018 10:00  Ref. Range 03/05/2018 09:06 03/05/2018 12:02 03/05/2018 17:46 03/05/2018 19:35 03/05/2018 21:41 03/06/2018 08:58  Glucose-Capillary Latest Ref Range: 70 - 99 mg/dL 73 98 86 65 (L) 409130 (H) 69 (L)   Diabetes history: DM Outpatient Diabetes medications:  Basaglar 30 units bid, Novolog 18 units tid with meals, Metformin 2000 mg daily with supper Current orders for Inpatient glycemic control:  Novolog 0-14 units tid 2 hours after meals, Novolog 18 units tid with meals, Lantus 30 units bid, Metformin XR 2000 mg with supper Inpatient Diabetes Program Recommendations:    Consider reducing Lantus to 28 units bid.    Thanks,  Beryl MeagerJenny Tabita Corbo, RN, BC-ADM Inpatient Diabetes Coordinator Pager [phone]

## 2018-03-06 NOTE — Progress Notes (Signed)
FACULTY PRACTICE ANTEPARTUM PROGRESS NOTE  Angela Barry is a 22 y.o. G2P0010 at [redacted]w[redacted]d who is admitted for uncontrolled T2DM.  Estimated Date of Delivery: 04/04/18 Fetal presentation is cephalic.  Length of Stay:  2 Days. Admitted 03/02/2018  Subjective:  Patient reports normal fetal movement.  She denies uterine contractions, denies bleeding and leaking of fluid per vagina.  Vitals:  Blood pressure (!) 124/92, pulse 93, temperature 98.7 F (37.1 C), temperature source Oral, resp. rate 18, height 5\' 2"  (1.575 m), weight 111.6 kg, last menstrual period 06/16/2017, SpO2 96 %. Physical Examination: CONSTITUTIONAL: Well-developed, well-nourished female in no acute distress.  HENT:  Normocephalic, atraumatic, External right and left ear normal. Oropharynx is clear and moist EYES: Conjunctivae and EOM are normal. Pupils are equal, round, and reactive to light. No scleral icterus.  NECK: Normal range of motion, supple, no masses. SKIN: Skin is warm and dry. No rash noted. Not diaphoretic. No erythema. No pallor. NEUROLGIC: Alert and oriented to person, place, and time. Normal reflexes, muscle tone coordination. No cranial nerve deficit noted. PSYCHIATRIC: Normal mood and affect. Normal behavior. Normal judgment and thought content. CARDIOVASCULAR: Normal heart rate noted, regular rhythm RESPIRATORY: Effort and breath sounds normal, no problems with respiration noted MUSCULOSKELETAL: Normal range of motion. No edema and no tenderness. ABDOMEN: Soft, nontender, nondistended, gravid. CERVIX: deferred  Fetal monitoring: FHR: 140 bpm, Variability: moderate, Accelerations: Present, Decelerations: Absent  Uterine activity: no contractions per hour  Results for orders placed or performed during the hospital encounter of 03/02/18 (from the past 48 hour(s))  Glucose, capillary     Status: Abnormal   Collection Time: 03/04/18  6:27 PM  Result Value Ref Range   Glucose-Capillary 109 (H) 70 - 99 mg/dL   Glucose, capillary     Status: Abnormal   Collection Time: 03/04/18  9:41 PM  Result Value Ref Range   Glucose-Capillary 65 (L) 70 - 99 mg/dL  Glucose, capillary     Status: Abnormal   Collection Time: 03/04/18 11:38 PM  Result Value Ref Range   Glucose-Capillary 125 (H) 70 - 99 mg/dL  Type and screen [facility] [place]     Status: None   Collection Time: 03/05/18  8:51 AM  Result Value Ref Range   ABO/RH(D) B NEG    Antibody Screen NEG    Sample Expiration      03/08/2018 Performed at [facility], [address] [place], [place] [address]   Glucose, capillary     Status: None   Collection Time: 03/05/18  9:06 AM  Result Value Ref Range   Glucose-Capillary 73 70 - 99 mg/dL  Glucose, capillary     Status: None   Collection Time: 03/05/18 12:02 PM  Result Value Ref Range   Glucose-Capillary 98 70 - 99 mg/dL  Glucose, capillary     Status: None   Collection Time: 03/05/18  5:46 PM  Result Value Ref Range   Glucose-Capillary 86 70 - 99 mg/dL  Glucose, capillary     Status: Abnormal   Collection Time: 03/05/18  7:35 PM  Result Value Ref Range   Glucose-Capillary 65 (L) 70 - 99 mg/dL  Glucose, capillary     Status: Abnormal   Collection Time: 03/05/18  9:41 PM  Result Value Ref Range   Glucose-Capillary 130 (H) 70 - 99 mg/dL  Glucose, capillary     Status: Abnormal   Collection Time: 03/06/18  8:58 AM  Result Value Ref Range   Glucose-Capillary 69 (L) 70 -  99 mg/dL  Glucose, capillary     Status: None   Collection Time: 03/06/18 11:32 AM  Result Value Ref Range   Glucose-Capillary 97 70 - 99 mg/dL    No results found.  Current scheduled medications . aspirin EC  81 mg Oral Daily  . docusate sodium  100 mg Oral Daily  . insulin aspart  0-14 Units Subcutaneous TID PC  . insulin aspart  18 Units Subcutaneous TID WC  . insulin glargine  30 Units Subcutaneous BID  . metFORMIN  2,000 mg Oral Q supper  . prenatal multivitamin  1 tablet Oral Q1200  .  sertraline  50 mg Oral Daily    I have reviewed the patient's current medications.  ASSESSMENT: Active Problems:   Supervision of high risk pregnancy, antepartum   Obesity during pregnancy with antepartum complication   Rh negative state in antepartum period   Gestational hypertension   Uncontrolled diabetes mellitus (HCC)   PLAN: FHT reactive this am, non-reactive last pm Cont home meds lantus 30 units BID novolog 18 units TID with meals SSIas needed, will titrate as needed BP stable For IOL 37 weeks Given uncontrolled DM as outpatient and intermittently reactive NST, will monitor in house for now   Continue routine antenatal care.   Baldemar LenisK. Meryl Jaelynne Hockley, M.D. Attending Center for [facility])  03/06/2018 2:16 PM

## 2018-03-07 ENCOUNTER — Inpatient Hospital Stay (HOSPITAL_BASED_OUTPATIENT_CLINIC_OR_DEPARTMENT_OTHER): Payer: No Typology Code available for payment source

## 2018-03-07 DIAGNOSIS — Z3A36 36 weeks gestation of pregnancy: Secondary | ICD-10-CM

## 2018-03-07 DIAGNOSIS — O139 Gestational [pregnancy-induced] hypertension without significant proteinuria, unspecified trimester: Secondary | ICD-10-CM

## 2018-03-07 DIAGNOSIS — O24113 Pre-existing diabetes mellitus, type 2, in pregnancy, third trimester: Secondary | ICD-10-CM

## 2018-03-07 DIAGNOSIS — O99213 Obesity complicating pregnancy, third trimester: Secondary | ICD-10-CM

## 2018-03-07 LAB — GLUCOSE, CAPILLARY
GLUCOSE-CAPILLARY: 71 mg/dL (ref 70–99)
Glucose-Capillary: 131 mg/dL — ABNORMAL HIGH (ref 70–99)
Glucose-Capillary: 117 mg/dL — ABNORMAL HIGH (ref 70–99)

## 2018-03-07 NOTE — Progress Notes (Addendum)
FACULTY PRACTICE ANTEPARTUM(COMPREHENSIVE) NOTE  Angela Barry is a 22 y.o. G2P0010 at 854w0d  who is admitted for who is admitted for uncontrolled T2DM.  Estimated Date of Delivery: 04/04/18 Fetal presentation is cephalic.Marland Kitchen.   Fetal presentation is cephalic. Length of Stay:  3  Days  Subjective:  Patient reports the fetal movement as active. Patient reports uterine contraction  activity as none. Patient reports  vaginal bleeding as none. Patient describes fluid per vagina as None.  Vitals:  Blood pressure 126/81, pulse 85, temperature 98.2 F (36.8 C), temperature source Oral, resp. rate 18, height 5\' 2"  (1.575 m), weight 111.6 kg, last menstrual period 06/16/2017, SpO2 98 %. Physical Examination:  General appearance - alert, well appearing, and in no distress and overweight Heart - normal rate and regular rhythm Abdomen - soft, nontender, nondistended Fundal Height:  size greater than dates obesity, afi 15 .  Cervical Exam: Not evaluated.  Extremities: extremities normal, atraumatic, no cyanosis or edema and Homans sign is negative, no sign of DVT  Membranes:intact  Fetal Monitoring:  Baseline: 135 bpm, Variability: Good {> 6 bpm), Accelerations: Reactive, Decelerations: Absent and currently  Each day the patient has some episodes where the Beat to beat is reduced,or absent, but at present FHR tracing looks excellent. Labs:  Results for orders placed or performed during the hospital encounter of 03/02/18 (from the past 24 hour(s))  Glucose, capillary   Collection Time: 03/06/18 11:32 AM  Result Value Ref Range   Glucose-Capillary 97 70 - 99 mg/dL  Glucose, capillary   Collection Time: 03/06/18  2:18 PM  Result Value Ref Range   Glucose-Capillary 69 (L) 70 - 99 mg/dL  Glucose, capillary   Collection Time: 03/06/18  4:24 PM  Result Value Ref Range   Glucose-Capillary 127 (H) 70 - 99 mg/dL  Glucose, capillary   Collection Time: 03/06/18 10:00 PM  Result Value Ref Range   Glucose-Capillary 114 (H) 70 - 99 mg/dL  Glucose, capillary   Collection Time: 03/07/18  8:39 AM  Result Value Ref Range   Glucose-Capillary 71 70 - 99 mg/dL  Glucose, capillary   Collection Time: 03/07/18 11:00 AM  Result Value Ref Range   Glucose-Capillary 131 (H) 70 - 99 mg/dL    Imaging Studies:     Currently EPIC will not allow sonographic studies to automatically populate into notes.  In the meantime, copy and paste results into note or free text.  Medications:  Scheduled . aspirin EC  81 mg Oral Daily  . docusate sodium  100 mg Oral Daily  . insulin aspart  0-14 Units Subcutaneous TID PC  . insulin aspart  18 Units Subcutaneous TID WC  . insulin glargine  30 Units Subcutaneous BID  . metFORMIN  2,000 mg Oral Q supper  . prenatal multivitamin  1 tablet Oral Q1200  . sertraline  50 mg Oral Daily   I have reviewed the patient's current medications.  ASSESSMENT: Patient Active Problem List   Diagnosis Date Noted  . Uncontrolled diabetes mellitus (HCC) 03/02/2018  . Gestational hypertension 02/05/2018  . Rh negative state in antepartum period 10/07/2017  . Vitamin D deficiency 10/07/2017  . Supervision of high risk pregnancy, antepartum 09/25/2017  . Obesity during pregnancy with antepartum complication 09/25/2017  . Uncontrolled type 2 diabetes mellitus with hyperglycemia, without long-term current use of insulin (HCC) 09/05/2017    PLAN: BPP later today inpt care til delivery at 37 wk or as clinically indicated  Angela Barry 03/07/2018,11:17 AM

## 2018-03-08 DIAGNOSIS — O24419 Gestational diabetes mellitus in pregnancy, unspecified control: Secondary | ICD-10-CM

## 2018-03-08 LAB — GLUCOSE, CAPILLARY
GLUCOSE-CAPILLARY: 92 mg/dL (ref 70–99)
Glucose-Capillary: 149 mg/dL — ABNORMAL HIGH (ref 70–99)
Glucose-Capillary: 65 mg/dL — ABNORMAL LOW (ref 70–99)
Glucose-Capillary: 70 mg/dL (ref 70–99)
Glucose-Capillary: 74 mg/dL (ref 70–99)
Glucose-Capillary: 91 mg/dL (ref 70–99)

## 2018-03-08 LAB — AMNISURE RUPTURE OF MEMBRANE (ROM) NOT AT ARMC: Amnisure ROM: POSITIVE

## 2018-03-08 LAB — TYPE AND SCREEN
ABO/RH(D): B NEG
ANTIBODY SCREEN: NEGATIVE

## 2018-03-08 MED ORDER — SILVER SULFADIAZINE 1 % EX CREA
TOPICAL_CREAM | Freq: Three times a day (TID) | CUTANEOUS | Status: DC
  Administered 2018-03-08 – 2018-03-11 (×7): via TOPICAL
  Filled 2018-03-08: qty 25
  Filled 2018-03-08 (×2): qty 85

## 2018-03-08 NOTE — Progress Notes (Signed)
FACULTY PRACTICE ANTEPARTUM(COMPREHENSIVE) NOTE  Angela Barry is a 22 y.o. G2P0010 at [redacted]w[redacted]d  who is admitted for poorly controlled diabetes type 2.  Since admission the blood sugars are improving.  Currently she is having fastings in the 60s and 70s.  Most of her blood sugars are under 130 she did have a single blood pressure yesterday peaking at 149 at midnight.  Yesterday her breakfast consisted of waffles with syrup on them, sugar-containing syrup Fetal presentation is cephalic. Length of Stay:  4  Days  Subjective: Patient is noting a few mild contractions today every 10 to 15 minutes no bleeding or rupture membranes this is a new finding Patient reports the fetal movement as active. Patient reports uterine contraction  activity as irregular, every 10 to 15 minutes, mild Patient reports  vaginal bleeding as none. Patient describes fluid per vagina as None.  Vitals:  Blood pressure (!) 129/91, pulse 81, temperature 98 F (36.7 C), temperature source Oral, resp. rate 18, height 5\' 2"  (1.575 m), weight 111.6 kg, last menstrual period 06/16/2017, SpO2 98 %. Physical Examination:  General appearance - alert, well appearing, and in no distress, oriented to person, place, and time and overweight Heart - normal rate and regular rhythm Abdomen - soft, nontender, nondistended Fundal Height:  size equals dates Cervical Exam: Not evaluated. Extremities: extremities normal, atraumatic, no cyanosis or edema and Homans sign is negative, no sign of DVT with DTRs 2+ bilaterally Membranes:intact  Fetal Monitoring:  Baseline: 150 bpm, Variability: Fair (1-6 bpm), Accelerations: Nonreactive but no decelerations, Decelerations: Absent and Occasional mild contraction every 15 minutes  Labs:  Results for orders placed or performed during the hospital encounter of 03/02/18 (from the past 24 hour(s))  Glucose, capillary   Collection Time: 03/07/18 11:00 AM  Result Value Ref Range   Glucose-Capillary  131 (H) 70 - 99 mg/dL  Glucose, capillary   Collection Time: 03/07/18  5:04 PM  Result Value Ref Range   Glucose-Capillary 117 (H) 70 - 99 mg/dL  Glucose, capillary   Collection Time: 03/08/18 12:07 AM  Result Value Ref Range   Glucose-Capillary 149 (H) 70 - 99 mg/dL  Type and screen [facility] [place]   Collection Time: 03/08/18  8:38 AM  Result Value Ref Range   ABO/RH(D) B NEG    Antibody Screen NEG    Sample Expiration      03/11/2018 Performed at [facility], [address] [place], [place] [address]   Glucose, capillary   Collection Time: 03/08/18  9:20 AM  Result Value Ref Range   Glucose-Capillary 65 (L) 70 - 99 mg/dL    Imaging Studies:     Currently EPIC will not allow sonographic studies to automatically populate into notes.  In the meantime, copy and paste results into note or free text.  Medications:  Scheduled . aspirin EC  81 mg Oral Daily  . docusate sodium  100 mg Oral Daily  . insulin aspart  0-14 Units Subcutaneous TID PC  . insulin aspart  18 Units Subcutaneous TID WC  . insulin glargine  30 Units Subcutaneous BID  . metFORMIN  2,000 mg Oral Q supper  . prenatal multivitamin  1 tablet Oral Q1200  . sertraline  50 mg Oral Daily   I have reviewed the patient's current medications.  ASSESSMENT: Patient Active Problem List   Diagnosis Date Noted  . Uncontrolled diabetes mellitus (HCC) 03/02/2018  . Gestational hypertension 02/05/2018  . Rh negative state in antepartum period 10/07/2017  .  Vitamin D deficiency 10/07/2017  . Supervision of high risk pregnancy, antepartum 09/25/2017  . Obesity during pregnancy with antepartum complication 09/25/2017  . Uncontrolled type 2 diabetes mellitus with hyperglycemia, without long-term current use of insulin (HCC) 09/05/2017    PLAN: No adjustments in insulin.  Currently she is on continuous monitoring.  Given that the BPP was 8 out of 8 yesterday we will just monitor every shift she has a BPP  repeat tomorrow Induction is at 37 weeks, Saturday Delivery earlier if spont labor will not intervene if contractions rates increase  Tilda BurrowJohn V Zeek Rostron 03/08/2018,10:19 AM    Patient ID: Angela Barry, female   DOB: 08/25/1995, 22 y.o.   MRN: [id number]

## 2018-03-08 NOTE — Progress Notes (Signed)
Results for Angela Barry, [name] (MRN [id number]) as of 03/08/2018 09:28  Ref. Range 03/08/2018 09:20  Glucose-Capillary Latest Ref Range: 70 - 99 mg/dL 65 (L)  Dr. Emelda FearFerguson made aware of above value.  Will continue to monitor, no orders received.  Patient eating breakfast.

## 2018-03-08 NOTE — Progress Notes (Signed)
Patient called out, questioning leaking of fluid. Called Dr. Despina HiddenEure, acknowledged, will collect amnisure.

## 2018-03-08 NOTE — Progress Notes (Signed)
Dr. Despina HiddenEure reviewed fetal strip.  Order received to monitor 30 minutes every shift or when appropriate.

## 2018-03-09 ENCOUNTER — Inpatient Hospital Stay (HOSPITAL_COMMUNITY): Payer: No Typology Code available for payment source | Admitting: Anesthesiology

## 2018-03-09 ENCOUNTER — Encounter (HOSPITAL_COMMUNITY)
Admission: AD | Disposition: A | Discharge status: Home / Self Care | Payer: Self-pay | Source: Home / Self Care | Attending: Obstetrics and Gynecology

## 2018-03-09 ENCOUNTER — Other Ambulatory Visit (HOSPITAL_COMMUNITY): Payer: 59

## 2018-03-09 ENCOUNTER — Encounter (HOSPITAL_COMMUNITY): Payer: Self-pay | Admitting: Anesthesiology

## 2018-03-09 ENCOUNTER — Ambulatory Visit (HOSPITAL_COMMUNITY): Admission: RE | Admit: 2018-03-09 | Payer: 59 | Source: Ambulatory Visit

## 2018-03-09 DIAGNOSIS — Z3A36 36 weeks gestation of pregnancy: Secondary | ICD-10-CM

## 2018-03-09 DIAGNOSIS — Z3490 Encounter for supervision of normal pregnancy, unspecified, unspecified trimester: Secondary | ICD-10-CM | POA: Diagnosis present

## 2018-03-09 LAB — CBC
HCT: 41.9 % (ref 36.0–46.0)
Hemoglobin: 14.3 g/dL (ref 12.0–15.0)
MCH: 30.7 pg (ref 26.0–34.0)
MCHC: 34.1 g/dL (ref 30.0–36.0)
MCV: 89.9 fL (ref 80.0–100.0)
Platelets: 183 10*3/uL (ref 150–400)
RBC: 4.66 MIL/uL (ref 3.87–5.11)
RDW: 13.7 % (ref 11.5–15.5)
WBC: 7 10*3/uL (ref 4.0–10.5)
nRBC: 0 % (ref 0.0–0.2)

## 2018-03-09 LAB — COMPREHENSIVE METABOLIC PANEL
ALT: 14 U/L (ref 0–44)
AST: 22 U/L (ref 15–41)
Albumin: 3.2 g/dL — ABNORMAL LOW (ref 3.5–5.0)
Alkaline Phosphatase: 148 U/L — ABNORMAL HIGH (ref 38–126)
Anion gap: 13 (ref 5–15)
BUN: 12 mg/dL (ref 6–20)
CO2: 20 mmol/L — ABNORMAL LOW (ref 22–32)
Calcium: 9.1 mg/dL (ref 8.9–10.3)
Chloride: 104 mmol/L (ref 98–111)
Creatinine, Ser: 0.89 mg/dL (ref 0.44–1.00)
GFR calc Af Amer: >60 mL/min (ref >60)
Glucose, Bld: 78 mg/dL (ref 70–99)
Potassium: 4 mmol/L (ref 3.5–5.1)
Sodium: 137 mmol/L (ref 135–145)
Total Bilirubin: 0.7 mg/dL (ref 0.3–1.2)
Total Protein: 7.4 g/dL (ref 6.5–8.1)

## 2018-03-09 LAB — GLUCOSE, CAPILLARY
GLUCOSE-CAPILLARY: 68 mg/dL — AB (ref 70–99)
GLUCOSE-CAPILLARY: 94 mg/dL (ref 70–99)
Glucose-Capillary: 59 mg/dL — ABNORMAL LOW (ref 70–99)
Glucose-Capillary: 75 mg/dL (ref 70–99)
Glucose-Capillary: 51 mg/dL — ABNORMAL LOW (ref 70–99)
Glucose-Capillary: 64 mg/dL — ABNORMAL LOW (ref 70–99)
Glucose-Capillary: 74 mg/dL (ref 70–99)
Glucose-Capillary: 64 mg/dL — ABNORMAL LOW (ref 70–99)
Glucose-Capillary: 82 mg/dL (ref 70–99)
Glucose-Capillary: 89 mg/dL (ref 70–99)
Glucose-Capillary: 62 mg/dL — ABNORMAL LOW (ref 70–99)
Glucose-Capillary: 173 mg/dL — ABNORMAL HIGH (ref 70–99)
Glucose-Capillary: 58 mg/dL — ABNORMAL LOW (ref 70–99)

## 2018-03-09 LAB — RPR: RPR Ser Ql: NONREACTIVE

## 2018-03-09 SURGERY — Surgical Case
Anesthesia: Spinal | Site: Abdomen | Wound class: Clean Contaminated

## 2018-03-09 MED ORDER — FENTANYL CITRATE (PF) 100 MCG/2ML IJ SOLN
INTRAMUSCULAR | Status: DC | PRN
  Administered 2018-03-09: 15 ug via INTRATHECAL

## 2018-03-09 MED ORDER — MAGNESIUM SULFATE BOLUS VIA INFUSION
4.0000 g | Freq: Once | INTRAVENOUS | Status: AC
  Administered 2018-03-09: 4 g via INTRAVENOUS
  Filled 2018-03-09: qty 500

## 2018-03-09 MED ORDER — SODIUM CHLORIDE 0.9 % IV SOLN
500.0000 mg | Freq: Once | INTRAVENOUS | Status: AC
  Administered 2018-03-09: 500 mg via INTRAVENOUS
  Filled 2018-03-09: qty 500

## 2018-03-09 MED ORDER — ONDANSETRON HCL 4 MG/2ML IJ SOLN
4.0000 mg | Freq: Four times a day (QID) | INTRAMUSCULAR | Status: DC | PRN
  Administered 2018-03-09 (×2): 4 mg via INTRAVENOUS
  Filled 2018-03-09 (×2): qty 2

## 2018-03-09 MED ORDER — LABETALOL HCL 5 MG/ML IV SOLN
40.0000 mg | INTRAVENOUS | Status: DC | PRN
  Administered 2018-03-09: 40 mg via INTRAVENOUS

## 2018-03-09 MED ORDER — FENTANYL CITRATE (PF) 100 MCG/2ML IJ SOLN
INTRAMUSCULAR | Status: AC
  Filled 2018-03-09: qty 2

## 2018-03-09 MED ORDER — OXYCODONE-ACETAMINOPHEN 5-325 MG PO TABS: 1.0000 | ORAL_TABLET | ORAL | Status: DC | PRN

## 2018-03-09 MED ORDER — SCOPOLAMINE 1 MG/3DAYS TD PT72
MEDICATED_PATCH | TRANSDERMAL | Status: DC | PRN
  Administered 2018-03-09: 1 via TRANSDERMAL

## 2018-03-09 MED ORDER — MAGNESIUM SULFATE 40 G IN LACTATED RINGERS - SIMPLE
2.0000 g/h | INTRAVENOUS | Status: AC
  Administered 2018-03-09: 2 g/h via INTRAVENOUS
  Filled 2018-03-09 (×2): qty 500

## 2018-03-09 MED ORDER — NALBUPHINE HCL 10 MG/ML IJ SOLN: 5.0000 mg | INTRAMUSCULAR | Status: DC | PRN

## 2018-03-09 MED ORDER — MORPHINE SULFATE (PF) 0.5 MG/ML IJ SOLN
INTRAMUSCULAR | Status: DC | PRN
  Administered 2018-03-09: .15 mg via INTRATHECAL

## 2018-03-09 MED ORDER — BUPIVACAINE HCL (PF) 0.5 % IJ SOLN
INTRAMUSCULAR | Status: DC | PRN
  Administered 2018-03-09: 30 mL

## 2018-03-09 MED ORDER — OXYTOCIN 10 UNIT/ML IJ SOLN
INTRAVENOUS | Status: DC | PRN
  Administered 2018-03-09: 40 [IU] via INTRAVENOUS

## 2018-03-09 MED ORDER — DEXAMETHASONE SODIUM PHOSPHATE 10 MG/ML IJ SOLN
INTRAMUSCULAR | Status: DC | PRN
  Administered 2018-03-09: 10 mg via INTRAVENOUS

## 2018-03-09 MED ORDER — LACTATED RINGERS IV SOLN
INTRAVENOUS | Status: DC
  Administered 2018-03-09 (×3): via INTRAVENOUS

## 2018-03-09 MED ORDER — FENTANYL CITRATE (PF) 100 MCG/2ML IJ SOLN
25.0000 ug | INTRAMUSCULAR | Status: DC | PRN
  Administered 2018-03-09: 50 ug via INTRAVENOUS

## 2018-03-09 MED ORDER — CEFAZOLIN SODIUM-DEXTROSE 2-3 GM-%(50ML) IV SOLR
INTRAVENOUS | Status: DC | PRN
  Administered 2018-03-09: 2 g via INTRAVENOUS

## 2018-03-09 MED ORDER — DIPHENHYDRAMINE HCL 25 MG PO CAPS: 25.0000 mg | ORAL_CAPSULE | ORAL | Status: DC | PRN

## 2018-03-09 MED ORDER — MEPERIDINE HCL 25 MG/ML IJ SOLN: 6.2500 mg | INTRAMUSCULAR | Status: DC | PRN

## 2018-03-09 MED ORDER — TERBUTALINE SULFATE 1 MG/ML IJ SOLN
0.2500 mg | Freq: Once | INTRAMUSCULAR | Status: AC | PRN
  Administered 2018-03-09: 0.25 mg via SUBCUTANEOUS
  Filled 2018-03-09: qty 1

## 2018-03-09 MED ORDER — LABETALOL HCL 5 MG/ML IV SOLN
80.0000 mg | INTRAVENOUS | Status: DC | PRN
  Administered 2018-03-09: 80 mg via INTRAVENOUS
  Filled 2018-03-09: qty 16

## 2018-03-09 MED ORDER — PHENYLEPHRINE 8 MG IN D5W 100 ML (0.08MG/ML) PREMIX OPTIME
INJECTION | INTRAVENOUS | Status: AC
  Filled 2018-03-09: qty 100

## 2018-03-09 MED ORDER — OXYTOCIN 10 UNIT/ML IJ SOLN
INTRAMUSCULAR | Status: AC
  Filled 2018-03-09: qty 4

## 2018-03-09 MED ORDER — HYDRALAZINE HCL 20 MG/ML IJ SOLN: 10.0000 mg | INTRAMUSCULAR | Status: DC | PRN

## 2018-03-09 MED ORDER — BETAMETHASONE SOD PHOS & ACET 6 (3-3) MG/ML IJ SUSP
12.0000 mg | INTRAMUSCULAR | Status: DC
  Administered 2018-03-09: 12 mg via INTRAMUSCULAR
  Filled 2018-03-09 (×2): qty 2

## 2018-03-09 MED ORDER — LACTATED RINGERS IV SOLN
INTRAVENOUS | Status: DC | PRN
  Administered 2018-03-09: 22:00:00 via INTRAVENOUS

## 2018-03-09 MED ORDER — SODIUM CHLORIDE 0.9 % IR SOLN
Status: DC | PRN
  Administered 2018-03-09: 1 via INTRAVESICAL

## 2018-03-09 MED ORDER — SODIUM CHLORIDE 0.9% FLUSH: 3.0000 mL | INTRAVENOUS | Status: DC | PRN

## 2018-03-09 MED ORDER — ONDANSETRON HCL 4 MG/2ML IJ SOLN
INTRAMUSCULAR | Status: DC | PRN
  Administered 2018-03-09: 4 mg via INTRAVENOUS

## 2018-03-09 MED ORDER — DEXTROSE 50 % IV SOLN
1.0000 | Freq: Once | INTRAVENOUS | Status: AC
  Administered 2018-03-09: 50 mL via INTRAVENOUS
  Filled 2018-03-09: qty 50

## 2018-03-09 MED ORDER — LACTATED RINGERS IV SOLN
500.0000 mL | INTRAVENOUS | Status: DC | PRN
  Administered 2018-03-09: 500 mL via INTRAVENOUS

## 2018-03-09 MED ORDER — NALBUPHINE HCL 10 MG/ML IJ SOLN: 5.0000 mg | Freq: Once | INTRAMUSCULAR | Status: DC | PRN

## 2018-03-09 MED ORDER — OXYCODONE-ACETAMINOPHEN 5-325 MG PO TABS: 2.0000 | ORAL_TABLET | ORAL | Status: DC | PRN

## 2018-03-09 MED ORDER — ONDANSETRON HCL 4 MG/2ML IJ SOLN: 4.0000 mg | Freq: Three times a day (TID) | INTRAMUSCULAR | Status: DC | PRN

## 2018-03-09 MED ORDER — DEXAMETHASONE SODIUM PHOSPHATE 10 MG/ML IJ SOLN
INTRAMUSCULAR | Status: AC
  Filled 2018-03-09: qty 1

## 2018-03-09 MED ORDER — METOCLOPRAMIDE HCL 5 MG/ML IJ SOLN
INTRAMUSCULAR | Status: DC | PRN
  Administered 2018-03-09: 10 mg via INTRAVENOUS

## 2018-03-09 MED ORDER — ACETAMINOPHEN 325 MG PO TABS: 650.0000 mg | ORAL_TABLET | ORAL | Status: DC | PRN

## 2018-03-09 MED ORDER — BUPIVACAINE IN DEXTROSE 0.75-8.25 % IT SOLN
INTRATHECAL | Status: DC | PRN
  Administered 2018-03-09: 1.4 mL via INTRATHECAL

## 2018-03-09 MED ORDER — DIPHENHYDRAMINE HCL 50 MG/ML IJ SOLN: 12.5000 mg | INTRAMUSCULAR | Status: DC | PRN

## 2018-03-09 MED ORDER — NALOXONE HCL 0.4 MG/ML IJ SOLN: 0.4000 mg | INTRAMUSCULAR | Status: DC | PRN

## 2018-03-09 MED ORDER — FENTANYL CITRATE (PF) 100 MCG/2ML IJ SOLN: 100.0000 ug | INTRAMUSCULAR | Status: DC | PRN

## 2018-03-09 MED ORDER — OXYTOCIN 40 UNITS IN LACTATED RINGERS INFUSION - SIMPLE MED
1.0000 m[IU]/min | INTRAVENOUS | Status: DC
  Administered 2018-03-09: 2 m[IU]/min via INTRAVENOUS
  Filled 2018-03-09: qty 1000

## 2018-03-09 MED ORDER — MORPHINE SULFATE (PF) 0.5 MG/ML IJ SOLN
INTRAMUSCULAR | Status: AC
  Filled 2018-03-09: qty 10

## 2018-03-09 MED ORDER — LIDOCAINE HCL (PF) 1 % IJ SOLN: 30.0000 mL | INTRAMUSCULAR | Status: DC | PRN

## 2018-03-09 MED ORDER — OXYTOCIN 40 UNITS IN LACTATED RINGERS INFUSION - SIMPLE MED: 2.5000 [IU]/h | INTRAVENOUS | Status: DC

## 2018-03-09 MED ORDER — BUPIVACAINE HCL (PF) 0.5 % IJ SOLN
INTRAMUSCULAR | Status: AC
  Filled 2018-03-09: qty 30

## 2018-03-09 MED ORDER — SOD CITRATE-CITRIC ACID 500-334 MG/5ML PO SOLN
30.0000 mL | ORAL | Status: DC | PRN
  Administered 2018-03-09: 30 mL via ORAL
  Filled 2018-03-09: qty 15

## 2018-03-09 MED ORDER — LABETALOL HCL 5 MG/ML IV SOLN
INTRAVENOUS | Status: AC
  Administered 2018-03-09: 20 mg via INTRAVENOUS
  Filled 2018-03-09: qty 16

## 2018-03-09 MED ORDER — SCOPOLAMINE 1 MG/3DAYS TD PT72
MEDICATED_PATCH | TRANSDERMAL | Status: AC
  Filled 2018-03-09: qty 1

## 2018-03-09 MED ORDER — OXYTOCIN BOLUS FROM INFUSION: 500.0000 mL | Freq: Once | INTRAVENOUS | Status: DC

## 2018-03-09 MED ORDER — ONDANSETRON HCL 4 MG/2ML IJ SOLN
INTRAMUSCULAR | Status: AC
  Filled 2018-03-09: qty 2

## 2018-03-09 MED ORDER — LABETALOL HCL 5 MG/ML IV SOLN
20.0000 mg | INTRAVENOUS | Status: DC | PRN
  Administered 2018-03-09: 20 mg via INTRAVENOUS

## 2018-03-09 MED ORDER — NALOXONE HCL 4 MG/10ML IJ SOLN
1.0000 ug/kg/h | INTRAVENOUS | Status: DC | PRN
  Filled 2018-03-09: qty 5

## 2018-03-09 SURGICAL SUPPLY — 32 items
BARRIER ADHS 3X4 INTERCEED (GAUZE/BANDAGES/DRESSINGS) IMPLANT
CHLORAPREP W/TINT 26ML (MISCELLANEOUS) ×3 IMPLANT
CLAMP CORD UMBIL (MISCELLANEOUS) IMPLANT
CLOTH BEACON ORANGE TIMEOUT ST (SAFETY) ×3 IMPLANT
DRSG OPSITE POSTOP 4X10 (GAUZE/BANDAGES/DRESSINGS) ×3 IMPLANT
ELECT REM PT RETURN 9FT ADLT (ELECTROSURGICAL) ×3
ELECTRODE REM PT RTRN 9FT ADLT (ELECTROSURGICAL) ×1 IMPLANT
EXTRACTOR VACUUM KIWI (MISCELLANEOUS) IMPLANT
GLOVE BIO SURGEON STRL SZ 6.5 (GLOVE) ×2 IMPLANT
GLOVE BIO SURGEONS STRL SZ 6.5 (GLOVE) ×1
GLOVE BIOGEL PI IND STRL 7.0 (GLOVE) ×2 IMPLANT
GLOVE BIOGEL PI INDICATOR 7.0 (GLOVE) ×4
GOWN STRL REUS W/TWL LRG LVL3 (GOWN DISPOSABLE) ×6 IMPLANT
KIT ABG SYR 3ML LUER SLIP (SYRINGE) ×3 IMPLANT
NEEDLE HYPO 22GX1.5 SAFETY (NEEDLE) IMPLANT
NEEDLE HYPO 25X5/8 SAFETYGLIDE (NEEDLE) ×3 IMPLANT
NS IRRIG 1000ML POUR BTL (IV SOLUTION) ×3 IMPLANT
PACK C SECTION WH (CUSTOM PROCEDURE TRAY) ×3 IMPLANT
PAD ABD DERMACEA PRESS 5X9 (GAUZE/BANDAGES/DRESSINGS) ×3 IMPLANT
PAD OB MATERNITY 4.3X12.25 (PERSONAL CARE ITEMS) ×3 IMPLANT
PENCIL SMOKE EVAC W/HOLSTER (ELECTROSURGICAL) ×3 IMPLANT
RETRACTOR WND ALEXIS 25 LRG (MISCELLANEOUS) IMPLANT
RTRCTR WOUND ALEXIS 25CM LRG (MISCELLANEOUS)
SPONGE LAP 18X18 RF (DISPOSABLE) ×9 IMPLANT
SUT PLAIN 2 0 XLH (SUTURE) ×3 IMPLANT
SUT VIC AB 0 CT1 36 (SUTURE) ×18 IMPLANT
SUT VIC AB 2-0 CT1 27 (SUTURE) ×2
SUT VIC AB 2-0 CT1 TAPERPNT 27 (SUTURE) ×1 IMPLANT
SUT VIC AB 4-0 PS2 27 (SUTURE) ×3 IMPLANT
SYR CONTROL 10ML LL (SYRINGE) IMPLANT
TOWEL OR 17X24 6PK STRL BLUE (TOWEL DISPOSABLE) ×3 IMPLANT
TRAY FOLEY W/BAG SLVR 14FR LF (SET/KITS/TRAYS/PACK) IMPLANT

## 2018-03-09 NOTE — Progress Notes (Signed)
Patient ID: Angela Barry, female   DOB: 05/27/1995, 22 y.o.   MRN: [id number] Vitals:   03/09/18 0430 03/09/18 0500 03/09/18 0530 03/09/18 0600  BP: (!) 158/99 128/72 120/60 (!) 152/86  Pulse: 96 87 82 90  Resp: 15 15 16 15   Temp:      TempSrc:      SpO2:      Weight:      Height:       FHR 150 with small accels UCs every 5min  Will start some Pitocin Foley still in place.

## 2018-03-09 NOTE — Progress Notes (Signed)
Hypoglycemic Event  CBG: 51  Treatment: 50 carb apple juice cup  Symptoms: "shakey"  Follow-up CBG: [id number]:0659 CBG Result:64  Possible Reasons for Event: Clear liquid diet in labor  Comments/MD notified:CNM Wynelle BourgeoisMarie Williams    Garald BraverErin H Keslee Harrington

## 2018-03-09 NOTE — Op Note (Signed)
Angela Barry PROCEDURE DATE: 03/09/2018  PREOPERATIVE DIAGNOSES: Intrauterine pregnancy at 10068w2d weeks gestation; fetal indications  POSTOPERATIVE DIAGNOSES: The same  PROCEDURE: Primary Low Transverse Cesarean Section  SURGEON:  Adam PhenixArnold, Areesha Dehaven G, MD  ASSISTANT:  Dr. Rhett BannisterLaurel Wallace  ANESTHESIOLOGY TEAM: Anesthesiologist: Mal AmabileFoster, Michael, MD CRNA: Jennelle Humanlayton, Lisa B, CRNA; Orlie PollenMerritt, Debra R, CRNA  INDICATIONS: Angela Barry is a 22 y.o. G2P0010 at 268w2d here for cesarean section secondary to the indications listed under preoperative diagnoses; please see preoperative note for further details.  The risks of cesarean section were discussed with the patient including but were not limited to: bleeding which may require transfusion or reoperation; infection which may require antibiotics; injury to bowel, bladder, ureters or other surrounding organs; injury to the fetus; need for additional procedures including hysterectomy in the event of a life-threatening hemorrhage; placental abnormalities wth subsequent pregnancies, incisional problems, thromboembolic phenomenon and other postoperative/anesthesia complications.   The patient concurred with the proposed plan, giving informed written consent for the procedure.    FINDINGS:  Viable female infant in cephalic presentation.  Apgars 5 and 7.  Clear amniotic fluid.  Intact placenta, three vessel cord.  Normal uterus, fallopian tubes and ovaries bilaterally.  ANESTHESIA: Epidural  INTRAVENOUS FLUIDS: 1500 ml   ESTIMATED BLOOD LOSS: 300 ml URINE OUTPUT:  100 ml SPECIMENS: Placenta sent to pathology COMPLICATIONS: None immediate  PROCEDURE IN DETAIL:  The patient preoperatively received intravenous antibiotics and had sequential compression devices applied to her lower extremities.  She was then taken to the operating room where the epidural anesthesia was dosed up to surgical level and was found to be adequate. She was then placed in a dorsal supine  position with a leftward tilt, and prepped and draped in a sterile manner.  A foley catheter was placed into her bladder and attached to constant gravity.  After an adequate timeout was performed, a Pfannenstiel skin incision was made with scalpel and carried through to the underlying layer of fascia. The fascia was incised in the midline, and this incision was extended bilaterally using the Mayo scissors.  Kocher clamps were applied to the superior aspect of the fascial incision and the underlying rectus muscles were dissected off bluntly and sharply.  A similar process was carried out on the inferior aspect of the fascial incision. The rectus muscles were separated in the midline and the peritoneum was entered bluntly. The Alexis self-retaining retractor was introduced into the abdominal cavity.  Attention was turned to the lower uterine segment where a low transverse hysterotomy was made with a scalpel and extended bilaterally bluntly.  The infant was successfully delivered, the cord was clamped and cut after one minute, and the infant was handed over to the awaiting neonatology team. Uterine massage was then administered, and the placenta delivered intact with a three-vessel cord. The uterus was then cleared of clots and debris.  The hysterotomy was closed with 0 Vicryl in a running locked fashion, and an imbricating layer was also placed with 0 Vicryl.  Figure-of-eight 0 Vicryl serosal stitches were placed to help with hemostasis.  The pelvis was cleared of all clot and debris. Hemostasis was confirmed on all surfaces.  The retractor was removed.  The peritoneum was closed with a 0 Vicryl running stitch. The fascia was then closed using 0 Vicryl in a running fashion.  The subcutaneous layer was irrigated, reapproximated with 2-0 plain gut interrupted stitches, and the skin was closed with a 4-0 Vicryl subcuticular stitch. The patient tolerated the procedure well.  Sponge, instrument and needle counts were  correct x 3.  She was taken to the recovery room in stable condition.    Adam Phenix, MD Obstetrician & Gynecologist, [facility] for [facility], [facility]

## 2018-03-09 NOTE — Anesthesia Procedure Notes (Signed)
Spinal  Patient location during procedure: OR Start time: 03/09/2018 9:18 PM End time: 03/09/2018 9:23 PM Staffing Anesthesiologist: Mal AmabileFoster, Manu Rubey, MD Performed: anesthesiologist  Preanesthetic Checklist Completed: patient identified, site marked, surgical consent, pre-op evaluation, timeout performed, IV checked, risks and benefits discussed and monitors and equipment checked Spinal Block Patient position: sitting Prep: site prepped and draped and DuraPrep Patient monitoring: heart rate, cardiac monitor, continuous pulse ox and blood pressure Approach: midline Location: L3-4 Injection technique: single-shot Needle Needle type: Pencan  Needle gauge: 24 G Needle length: 9 cm Needle insertion depth: 7 cm Assessment Sensory level: T2 Additional Notes Patient tolerated procedure well. Adequate sensory level.

## 2018-03-09 NOTE — Progress Notes (Signed)
CBG @ 0200 was 59. Pt. Was not symptomatic. Pt given a sprite to drink and repeat CBG @ 0220 was 75. Will recheck in 4 hours per CNM verbal order.  Oletta Darter. Britne Borelli, RN

## 2018-03-09 NOTE — Progress Notes (Signed)
Angela Barry is a 22 y.o. G2P0010 at 214w2d  Subjective:   Objective: BP (!) 149/74 (BP Location: Left Arm)   Pulse (!) 127   Temp (!) 101 F (38.3 C) (Oral)   Resp 20   Ht 5\' 2"  (1.575 m)   Wt 111.6 kg   LMP 06/16/2017   SpO2 100%   BMI 44.99 kg/m  No intake/output data recorded. No intake/output data recorded.  FHT:  FHR: 150 bpm, variability: minimal ,  accelerations:  Abscent,  decelerations:  Absent UC:   regular, every 3 minutes SVE:   Dilation: 6 Effacement (%): 90 Station: -3 Exam by:: Marlis EdelsonL. Wallace, DO  Labs: Lab Results  Component Value Date   WBC 7.0 03/09/2018   HGB 14.3 03/09/2018   HCT 41.9 03/09/2018   MCV 89.9 03/09/2018   PLT 183 03/09/2018    Assessment / Plan: Arrest in active phase of labor  Labor: no progress Preeclampsia:  on magnesium sulfate Fetal Wellbeing:  Category II Pain Control:  Epidural I/D:  IV anitibiotics ordered Anticipated MOD:   Non reassuring fetal surveillance and prolonged active stage, recommend CS The risks of cesarean section discussed with the patient included but were not limited to: bleeding which may require transfusion or reoperation; infection which may require antibiotics; injury to bowel, bladder, ureters or other surrounding organs; injury to the fetus; need for additional procedures including hysterectomy in the event of a life-threatening hemorrhage; placental abnormalities wth subsequent pregnancies, incisional problems, thromboembolic phenomenon and other postoperative/anesthesia complications. The patient concurred with the proposed plan, giving informed written consent for the procedure.   Patient has been NPO since >8 hr she will remain NPO for procedure. Anesthesia and OR aware. Preoperative prophylactic antibiotics and SCDs ordered on call to the OR.  To OR when ready.    Scheryl DarterJames Arnold 03/09/2018, 9:17 PM

## 2018-03-09 NOTE — Progress Notes (Signed)
Patient ID: Angela Barry, female   DOB: 05/04/1995, 22 y.o.   MRN: [id number] [facility]  Vitals:   03/09/18 0005 03/09/18 0021 03/09/18 0022 03/09/18 0031  BP: (!) 157/96   (!) 145/96  Pulse: 98   99  Resp:    18  Temp:   98.7 F (37.1 C)   TempSrc:   Oral   SpO2:      Weight:  111.6 kg    Height:  5\' 2"  (1.575 m)     FHR reassuring, but accels are small and variability is only fair UCs about every 5-6 min  .Dilation: 1.5 Effacement (%): 60 Station: -2 Presentation: Vertex Exam by:: Cynda AcresMarie CNM   Will insert  Foley balloon Will watch labor pattern for a while, then may augment with Pitocin.

## 2018-03-09 NOTE — Anesthesia Preprocedure Evaluation (Signed)
Anesthesia Evaluation  Patient identified by MRN, date of birth, ID band Patient awake    Reviewed: Allergy & Precautions, NPO status , Patient's Chart, lab work & pertinent test results  Airway Mallampati: III  TM Distance: >3 FB Neck ROM: Full    Dental no notable dental hx. (+) Teeth Intact   Pulmonary neg pulmonary ROS,    Pulmonary exam normal breath sounds clear to auscultation       Cardiovascular hypertension, Normal cardiovascular exam Rhythm:Regular Rate:Normal     Neuro/Psych negative neurological ROS  negative psych ROS   GI/Hepatic GERD  ,  Endo/Other  diabetes, Poorly Controlled, Type 2, Oral Hypoglycemic Agents, Insulin DependentMorbid obesity  Renal/GU   negative genitourinary   Musculoskeletal negative musculoskeletal ROS (+)   Abdominal (+) + obese,   Peds  Hematology   Anesthesia Other Findings   Reproductive/Obstetrics (+) Pregnancy Uncontrolled type 2 DM Gestational HTN                             Anesthesia Physical Anesthesia Plan  ASA: III and emergent  Anesthesia Plan: Spinal   Post-op Pain Management:    Induction:   PONV Risk Score and Plan: 4 or greater and Ondansetron, Dexamethasone, Treatment may vary due to age or medical condition and Scopolamine patch - Pre-op  Airway Management Planned: Natural Airway  Additional Equipment:   Intra-op Plan:   Post-operative Plan:   Informed Consent: I have reviewed the patients History and Physical, chart, labs and discussed the procedure including the risks, benefits and alternatives for the proposed anesthesia with the patient or authorized representative who has indicated his/her understanding and acceptance.   Dental advisory given  Plan Discussed with: CRNA, Anesthesiologist and Surgeon  Anesthesia Plan Comments:         Anesthesia Quick Evaluation

## 2018-03-09 NOTE — Progress Notes (Signed)
LABOR PROGRESS NOTE  Angela Barry is a 22 y.o. G2P0010 at 3274w2d  admitted for IOL with PPROM with history of NRNST on 12/2.  Subjective: Comfortable through contractions on nitrous.  Objective: BP (!) 145/92   Pulse 98   Temp 98.3 F (36.8 C) (Oral)   Resp 17   Ht 5\' 2"  (1.575 m)   Wt 111.6 kg   LMP 06/16/2017   SpO2 97%   BMI 44.99 kg/m  or  Vitals:   03/09/18 1410 03/09/18 1438 03/09/18 1445 03/09/18 1511  BP: (!) 145/98 (!) 162/84 (!) 145/90 (!) 145/92  Pulse: 97 95 99 98  Resp: 16 18  17   Temp:  98.3 F (36.8 C)    TempSrc:  Oral    SpO2:      Weight:      Height:       Dilation: 4.5 Effacement (%): 80 Station: -1 Presentation: Vertex Exam by:: Dr. Gerri LinsPeiffer FHT: baseline rate 150s, minimal variability, no acel, no decel Toco: Q542min  Labs: Lab Results  Component Value Date   WBC 7.0 03/09/2018   HGB 14.3 03/09/2018   HCT 41.9 03/09/2018   MCV 89.9 03/09/2018   PLT 183 03/09/2018    Patient Active Problem List   Diagnosis Date Noted  . Encounter for induction of labor 03/09/2018  . Uncontrolled diabetes mellitus (HCC) 03/02/2018  . Gestational hypertension 02/05/2018  . Rh negative state in antepartum period 10/07/2017  . Vitamin D deficiency 10/07/2017  . Supervision of high risk pregnancy, antepartum 09/25/2017  . Obesity during pregnancy with antepartum complication 09/25/2017  . Uncontrolled type 2 diabetes mellitus with hyperglycemia, without long-term current use of insulin (HCC) 09/05/2017    Assessment / Plan: Angela Barry is a 22 y.o. G2P0010 at 7874w2d  admitted for IOL with PPROM with history of NRNST on 12/2.  Labor: Continuing Pitocin. Fetal Wellbeing:  Tolerating well at this time. Continue to monitor. Pain Control:  Nitrous Anticipated MOD:  Vaginal  Wendie AgresteSarah Asman Jonee Lamore, MD PGY-1 Family Medicine Resident 03/09/2018, 3:46 PM\

## 2018-03-09 NOTE — Transfer of Care (Signed)
Immediate Anesthesia Transfer of Care Note  Patient: Angela Barry  Procedure(s) Performed: CESAREAN SECTION (N/A Abdomen)  Patient Location: PACU  Anesthesia Type:Spinal  Level of Consciousness: awake, alert , oriented and patient cooperative  Airway & Oxygen Therapy: Patient Spontanous Breathing  Post-op Assessment: Report given to RN and Post -op Vital signs reviewed and stable  Post vital signs: Reviewed and stable  Last Vitals:  Vitals Value Taken Time  BP 126/69 03/09/2018 10:47 PM  Temp    Pulse 103 03/09/2018 10:51 PM  Resp 19 03/09/2018 10:51 PM  SpO2 92 % 03/09/2018 10:51 PM  Vitals shown include unvalidated device data.  Last Pain:  Vitals:   03/09/18 2100  TempSrc: Oral  PainSc:       Patients Stated Pain Goal: 0 (03/08/18 2009)  Complications: No apparent anesthesia complications

## 2018-03-09 NOTE — Progress Notes (Signed)
LABOR PROGRESS NOTE  Angela Barry is a 22 y.o. G2P0010 at 7253w2d  admitted for IOL with PPROM with history of NRNST on 12/2.  Subjective: Moderately uncomfortable with contractions.   Objective: BP (!) 154/101   Pulse (!) 102   Temp 98 F (36.7 C) (Oral)   Resp 16   Ht 5\' 2"  (1.575 m)   Wt 111.6 kg   LMP 06/16/2017   SpO2 97%   BMI 44.99 kg/m  or  Vitals:   03/09/18 0904 03/09/18 0932 03/09/18 1019 03/09/18 1058  BP: (!) 151/99 136/81 127/84 (!) 154/101  Pulse: 95 86 85 (!) 102  Resp: 18 18 18 16   Temp:   98 F (36.7 C)   TempSrc:   Oral   SpO2:      Weight:      Height:       Dilation: (defer exam due to foley bulb) Effacement (%): 60 Station: -2 Presentation: Vertex Exam by:: Angela Barry CNM  FHT: baseline rate 150s, moderate variability, no acel, no decel Toco: Q662min  Labs: Lab Results  Component Value Date   WBC 7.0 03/09/2018   HGB 14.3 03/09/2018   HCT 41.9 03/09/2018   MCV 89.9 03/09/2018   PLT 183 03/09/2018    Patient Active Problem List   Diagnosis Date Noted  . Encounter for induction of labor 03/09/2018  . Uncontrolled diabetes mellitus (HCC) 03/02/2018  . Gestational hypertension 02/05/2018  . Rh negative state in antepartum period 10/07/2017  . Vitamin D deficiency 10/07/2017  . Supervision of high risk pregnancy, antepartum 09/25/2017  . Obesity during pregnancy with antepartum complication 09/25/2017  . Uncontrolled type 2 diabetes mellitus with hyperglycemia, without long-term current use of insulin (HCC) 09/05/2017    Assessment / Plan: Angela Barry is a 22 y.o. G2P0010 at 4253w2d  admitted for IOL with PPROM with history of NRNST on 12/2.  Labor: Foley out. Continuing Pitocin.  Fetal Wellbeing:  Tolerating well at this time. Continue to monitor. Pain Control:  Fentanyl PRN. Declines epidural at this time. Anticipated MOD:  Vaginal  Angela AgresteSarah Asman Saige Busby, MD PGY-1 Family Medicine Resident 03/09/2018, 11:24 AM\

## 2018-03-09 NOTE — Progress Notes (Signed)
Patient ID: Angela Barry, female   DOB: 04/06/1995, 22 y.o.   MRN: [id number] Foley inserted  Cervix almost 2cm/60/-2/vertex  Leaking MSAF  FHR stable  Will watch glucoses.  May need glucomander later

## 2018-03-09 NOTE — Anesthesia Pain Management Evaluation Note (Signed)
  CRNA Pain Management Visit Note  Patient: Angela Barry, 22 y.o., female  "Hello I am a member of the anesthesia team at [facility]. We have an anesthesia team available at all times to provide care throughout the hospital, including epidural management and anesthesia for C-section. I don't know your plan for the delivery whether it a natural birth, water birth, IV sedation, nitrous supplementation, doula or epidural, but we want to meet your pain goals."   1.Was your pain managed to your expectations on prior hospitalizations?   No prior hospitalizations  2.What is your expectation for pain management during this hospitalization?     Epidural and IV pain meds  3.How can we help you reach that goal? epidural  Record the patient's initial score and the patient's pain goal.   Pain: 4  Pain Goal: 5 [facility] wants you to be able to say your pain was always managed very well.  [name] 03/09/2018

## 2018-03-10 ENCOUNTER — Encounter (HOSPITAL_COMMUNITY): Payer: Self-pay | Admitting: Obstetrics & Gynecology

## 2018-03-10 LAB — GLUCOSE, CAPILLARY
GLUCOSE-CAPILLARY: 72 mg/dL (ref 70–99)
Glucose-Capillary: 94 mg/dL (ref 70–99)
Glucose-Capillary: 105 mg/dL — ABNORMAL HIGH (ref 70–99)
Glucose-Capillary: 101 mg/dL — ABNORMAL HIGH (ref 70–99)
Glucose-Capillary: 136 mg/dL — ABNORMAL HIGH (ref 70–99)
Glucose-Capillary: 79 mg/dL (ref 70–99)
Glucose-Capillary: 120 mg/dL — ABNORMAL HIGH (ref 70–99)

## 2018-03-10 LAB — CREATININE, SERUM
Creatinine, Ser: 1.04 mg/dL — ABNORMAL HIGH (ref 0.44–1.00)
GFR calc Af Amer: >60 mL/min
GFR calc non Af Amer: >60 mL/min

## 2018-03-10 LAB — CBC
HCT: 39.4 % (ref 36.0–46.0)
Hemoglobin: 13.4 g/dL (ref 12.0–15.0)
MCH: 30.7 pg (ref 26.0–34.0)
MCHC: 34 g/dL (ref 30.0–36.0)
MCV: 90.2 fL (ref 80.0–100.0)
Platelets: 180 10*3/uL (ref 150–400)
RBC: 4.37 MIL/uL (ref 3.87–5.11)
RDW: 13.6 % (ref 11.5–15.5)
WBC: 17.4 10*3/uL — ABNORMAL HIGH (ref 4.0–10.5)
nRBC: 0 % (ref 0.0–0.2)

## 2018-03-10 MED ORDER — LACTATED RINGERS IV SOLN
INTRAVENOUS | Status: DC
  Administered 2018-03-10 (×2): via INTRAVENOUS

## 2018-03-10 MED ORDER — ZOLPIDEM TARTRATE 5 MG PO TABS: 5.0000 mg | ORAL_TABLET | Freq: Every evening | ORAL | Status: DC | PRN

## 2018-03-10 MED ORDER — ENOXAPARIN SODIUM 60 MG/0.6ML ~~LOC~~ SOLN
55.0000 mg | SUBCUTANEOUS | Status: DC
  Administered 2018-03-11: 55 mg via SUBCUTANEOUS
  Filled 2018-03-10 (×2): qty 0.6

## 2018-03-10 MED ORDER — TETANUS-DIPHTH-ACELL PERTUSSIS 5-2.5-18.5 LF-MCG/0.5 IM SUSP
0.5000 mL | Freq: Once | INTRAMUSCULAR | Status: DC
  Filled 2018-03-10: qty 0.5

## 2018-03-10 MED ORDER — RHO D IMMUNE GLOBULIN 1500 UNIT/2ML IJ SOSY
300.0000 ug | PREFILLED_SYRINGE | Freq: Once | INTRAMUSCULAR | Status: AC
  Administered 2018-03-10: 300 ug via INTRAVENOUS
  Filled 2018-03-10: qty 2

## 2018-03-10 MED ORDER — SIMETHICONE 80 MG PO CHEW
80.0000 mg | CHEWABLE_TABLET | ORAL | Status: DC
  Administered 2018-03-10 – 2018-03-12 (×2): 80 mg via ORAL
  Filled 2018-03-10 (×2): qty 1

## 2018-03-10 MED ORDER — DIPHENHYDRAMINE HCL 25 MG PO CAPS: 25.0000 mg | ORAL_CAPSULE | Freq: Four times a day (QID) | ORAL | Status: DC | PRN

## 2018-03-10 MED ORDER — PRENATAL MULTIVITAMIN CH
1.0000 | ORAL_TABLET | Freq: Every day | ORAL | Status: DC
  Filled 2018-03-10: qty 1

## 2018-03-10 MED ORDER — METFORMIN HCL 500 MG PO TABS
1000.0000 mg | ORAL_TABLET | Freq: Two times a day (BID) | ORAL | Status: DC
  Administered 2018-03-10 – 2018-03-12 (×4): 1000 mg via ORAL
  Filled 2018-03-10 (×7): qty 2

## 2018-03-10 MED ORDER — DIBUCAINE 1 % RE OINT: 1.0000 "application " | TOPICAL_OINTMENT | RECTAL | Status: DC | PRN

## 2018-03-10 MED ORDER — WITCH HAZEL-GLYCERIN EX PADS: 1.0000 "application " | MEDICATED_PAD | CUTANEOUS | Status: DC | PRN

## 2018-03-10 MED ORDER — MENTHOL 3 MG MT LOZG: 1.0000 | LOZENGE | OROMUCOSAL | Status: DC | PRN

## 2018-03-10 MED ORDER — COCONUT OIL OIL
1.0000 "application " | TOPICAL_OIL | Status: DC | PRN
  Administered 2018-03-12: 1 via TOPICAL
  Filled 2018-03-10: qty 120

## 2018-03-10 MED ORDER — SIMETHICONE 80 MG PO CHEW
80.0000 mg | CHEWABLE_TABLET | Freq: Three times a day (TID) | ORAL | Status: DC
  Administered 2018-03-10 – 2018-03-12 (×8): 80 mg via ORAL
  Filled 2018-03-10 (×8): qty 1

## 2018-03-10 MED ORDER — SIMETHICONE 80 MG PO CHEW: 80.0000 mg | CHEWABLE_TABLET | ORAL | Status: DC | PRN

## 2018-03-10 MED ORDER — OXYCODONE HCL 5 MG PO TABS
5.0000 mg | ORAL_TABLET | ORAL | Status: DC | PRN
  Administered 2018-03-11: 10 mg via ORAL
  Administered 2018-03-11 (×2): 5 mg via ORAL
  Administered 2018-03-11: 10 mg via ORAL
  Filled 2018-03-10: qty 2
  Filled 2018-03-10 (×2): qty 1
  Filled 2018-03-10: qty 2

## 2018-03-10 MED ORDER — SENNOSIDES-DOCUSATE SODIUM 8.6-50 MG PO TABS
2.0000 | ORAL_TABLET | ORAL | Status: DC
  Administered 2018-03-10 – 2018-03-12 (×2): 2 via ORAL
  Filled 2018-03-10 (×2): qty 2

## 2018-03-10 MED ORDER — INSULIN GLARGINE 100 UNIT/ML ~~LOC~~ SOLN
8.0000 [IU] | Freq: Two times a day (BID) | SUBCUTANEOUS | Status: DC
  Administered 2018-03-10 – 2018-03-12 (×5): 8 [IU] via SUBCUTANEOUS
  Filled 2018-03-10 (×6): qty 0.08

## 2018-03-10 MED ORDER — INSULIN GLARGINE 100 UNIT/ML ~~LOC~~ SOLN
15.0000 [IU] | Freq: Two times a day (BID) | SUBCUTANEOUS | Status: DC
  Filled 2018-03-10: qty 0.15

## 2018-03-10 MED ORDER — INSULIN ASPART 100 UNIT/ML ~~LOC~~ SOLN
0.0000 [IU] | Freq: Three times a day (TID) | SUBCUTANEOUS | Status: DC
  Administered 2018-03-10: 4 [IU] via SUBCUTANEOUS
  Administered 2018-03-10 (×2): 3 [IU] via SUBCUTANEOUS

## 2018-03-10 MED ORDER — OXYTOCIN 40 UNITS IN LACTATED RINGERS INFUSION - SIMPLE MED: 2.5000 [IU]/h | INTRAVENOUS | Status: AC

## 2018-03-10 NOTE — Anesthesia Postprocedure Evaluation (Signed)
Anesthesia Post Note  Patient: Angela Barry  Procedure(s) Performed: CESAREAN SECTION (N/A Abdomen)     Patient location during evaluation: Other Anesthesia Type: Spinal Level of consciousness: awake and alert Pain management: pain level controlled Vital Signs Assessment: post-procedure vital signs reviewed and stable Respiratory status: spontaneous breathing Cardiovascular status: stable Postop Assessment: no headache, spinal receding, adequate PO intake, no backache, patient able to bend at knees, able to ambulate and no apparent nausea or vomiting Anesthetic complications: no    Last Vitals:  Vitals:   03/10/18 0600 03/10/18 0806  BP:  134/77  Pulse:  93  Resp: 18 18  Temp:  (!) 35.8 C  SpO2: 97% 97%    Last Pain:  Vitals:   03/10/18 0806  TempSrc: Axillary  PainSc:    Pain Goal: Patients Stated Pain Goal: 0 (03/08/18 2009)               Ariana Cavenaugh, [place]

## 2018-03-10 NOTE — Progress Notes (Signed)
Subjective: Postpartum Day 1: Cesarean Delivery Patient reports incisional pain and tolerating PO.    Objective: Vital signs in last 24 hours: Temp:  [97.5 F (36.4 C)-101 F (38.3 C)] 97.9 F (36.6 C) (12/10 0445) Pulse Rate:  [85-129] 91 (12/10 0445) Resp:  [16-30] 18 (12/10 0600) BP: (123-170)/(69-102) 128/83 (12/10 0445) SpO2:  [91 %-100 %] 97 % (12/10 0600)  Physical Exam:  General: alert, cooperative and no distress Lochia: appropriate Uterine Fundus: firm Incision: no significant drainage DVT Evaluation: No evidence of DVT seen on physical exam.  Recent Labs    03/09/18 0336 03/10/18 0551  HGB 14.3 13.4  HCT 41.9 39.4   CBG (last 3)  Recent Labs    03/09/18 1851 03/09/18 1923 03/10/18 0457  GLUCAP 58* 173* 105*    Assessment/Plan: Status post Cesarean section. Doing well postoperatively.  Continue current care.  Scheryl DarterJames Stefan Markarian 03/10/2018, 7:50 AM

## 2018-03-10 NOTE — Lactation Note (Signed)
This note was copied from a baby's chart. Lactation Consultation Note  Patient Name: Boy Court Joynthonia Comins ZOXWR'UToday's Date: 03/10/2018 Reason for consult: Initial assessment;NICU baby;[facility] Breastfeeding consultation services and support information given to mom.  Providing Breastmilk For Your Baby in NICU book given and reviewed.  Baby is 14 hours old and mom has pumped 3 times.  No milk obtained.  Instructed to pump 8-12 times/24 hours followed by hand expression.  Mom does have a DEBP at home.  No questions or concerns.  Encouraged to call for assist prn.  Maternal Data    Feeding    LATCH Score                   Interventions    Lactation Tools Discussed/Used Initiated by:: RN Date initiated:: 03/10/18   Consult Status Consult Status: Follow-up Date: 03/11/18 Follow-up type: In-patient    Huston FoleyMOULDEN, Calin Ellery S 03/10/2018, 12:34 PM

## 2018-03-10 NOTE — Anesthesia Postprocedure Evaluation (Signed)
Anesthesia Post Note  Patient: Angela Barry  Procedure(s) Performed: CESAREAN SECTION (N/A Abdomen)     Patient location during evaluation: PACU Anesthesia Type: Spinal Level of consciousness: oriented and awake and alert Pain management: pain level controlled Vital Signs Assessment: post-procedure vital signs reviewed and stable Respiratory status: spontaneous breathing, respiratory function stable and nonlabored ventilation Cardiovascular status: blood pressure returned to baseline and stable Postop Assessment: no headache, no backache, no apparent nausea or vomiting, patient able to bend at knees and spinal receding Anesthetic complications: no    Last Vitals:  Vitals:   03/09/18 2330 03/09/18 2345  BP: 140/78   Pulse: (!) 106   Resp: (!) 21   Temp:  36.5 C  SpO2: 95%     Last Pain:  Vitals:   03/09/18 2345  TempSrc: Oral  PainSc: 4    Pain Goal: Patients Stated Pain Goal: 0 (03/08/18 2009)               Eliya Geiman A.

## 2018-03-10 NOTE — Plan of Care (Signed)
Pts. Condition will continue to improve 

## 2018-03-10 NOTE — Addendum Note (Signed)
Addendum  created 03/10/18 0858 by Armanda HeritageSterling, Zinedine Ellner M, CRNA   Sign clinical note

## 2018-03-11 LAB — RH IG WORKUP (INCLUDES ABO/RH)
ABO/RH(D): B NEG
Fetal Screen: NEGATIVE
Gestational Age(Wks): 36.2
UNIT DIVISION: 0

## 2018-03-11 LAB — GLUCOSE, CAPILLARY
Glucose-Capillary: 55 mg/dL — ABNORMAL LOW (ref 70–99)
Glucose-Capillary: 79 mg/dL (ref 70–99)
Glucose-Capillary: 118 mg/dL — ABNORMAL HIGH (ref 70–99)
Glucose-Capillary: 85 mg/dL (ref 70–99)
Glucose-Capillary: 77 mg/dL (ref 70–99)
Glucose-Capillary: 84 mg/dL (ref 70–99)

## 2018-03-11 MED ORDER — ENALAPRIL MALEATE 10 MG PO TABS
10.0000 mg | ORAL_TABLET | Freq: Every day | ORAL | Status: DC
  Administered 2018-03-11 – 2018-03-12 (×2): 10 mg via ORAL
  Filled 2018-03-11 (×3): qty 1

## 2018-03-11 NOTE — Lactation Note (Signed)
This note was copied from a baby's chart. Lactation Consultation Note  Patient Name: Angela Barry   Mother resting.  Grandmother states mother has been pumping q 3 hours (drops) with hand expression.  Lactation will follow up PRN.       Maternal Data    Feeding Feeding Type: Donor Breast Milk  LATCH Score                   Interventions    Lactation Tools Discussed/Used     Consult Status      Angela Barry, Angela Barry Barry, 1:39 PM

## 2018-03-11 NOTE — Progress Notes (Addendum)
Hypoglycemic Event  CBG: 55  Treatment: 1 apple juice given will recheck in 15 min  Symptoms: No symtoms  Follow-up CBG: Time: 0705 CBG Result: 79  Possible Reasons for Event: unknown  Comments/MD notified: Will notify physician if blood sugar doesn't raise after 15 min.     August AlbinoHannah  Jaquell Seddon, RN

## 2018-03-11 NOTE — Progress Notes (Signed)
Inpatient Diabetes Program Recommendations  AACE/ADA: New Consensus Statement on Inpatient Glycemic Control (2015)  Target Ranges:  Prepandial:   less than 140 mg/dL      Peak postprandial:   less than 180 mg/dL (1-2 hours)      Critically ill patients:  140 - 180 mg/dL   Lab Results  Component Value Date   GLUCAP 79 03/11/2018   HGBA1C 7.8 (H) 12/19/2017    Review of Glycemic Control Results for Angela Barry, Angela Barry (MRN [id number]) as of 03/11/2018 08:55  Ref. Range 03/10/2018 20:28 03/10/2018 23:03 03/11/2018 06:40 03/11/2018 07:05  Glucose-Capillary Latest Ref Range: 70 - 99 mg/dL 72 409120 (H) 55 (L) 79   Diabetes history: DM Outpatient Diabetes medications: Basaglar 30 units bid, Novolog 18 units tid with meals, Metformin 2000 mg daily with supper Current orders for Inpatient glycemic control: Lantus 8 units BID, Novolog 0-24 units TID & HS, Metformin 1000 mg BID  Inpatient Diabetes Program Recommendations:    Noted hypoglycemic episode this am of 55 mg/dL. Consider further decreasing Lantus to 12 units QD.  Additionally, consider changing correction for the postpartum patient: Novolog 0-9 units TID &HS under the glycemic control order set.   Thanks, Lujean RaveLauren Amalia [place], MSN, RNC-OB Diabetes Coordinator [phone] (8a-5p)

## 2018-03-11 NOTE — Progress Notes (Signed)
Postpartum Day 2: Cesarean Delivery  Subjective: Rounded on patient in NICU.  Patient reports incisional pain, tolerating PO, + flatus and no problems voiding.  Baby is doing well, under lights currently. Patient denies any headaches, visual symptoms, RUQ/epigastric pain. Breastfeeding.  Objective: Vital signs in last 24 hours: Temp:  [97.9 F (36.6 C)-99.9 F (37.7 C)] 99.8 F (37.7 C) (12/11 0754) Pulse Rate:  [92-110] 110 (12/11 0754) Resp:  [18] 18 (12/11 0754) BP: (132-150)/(80-99) 133/80 (12/11 0754) SpO2:  [94 %-98 %] 94 % (12/11 0754)  Patient Vitals for the past 24 hrs:  BP Temp Temp src Pulse Resp SpO2  03/11/18 0754 133/80 99.8 F (37.7 C) - (!) 110 18 94 %  03/11/18 0500 132/81 99.9 F (37.7 C) Oral (!) 106 18 97 %  03/10/18 2327 (!) 143/96 98.8 F (37.1 C) Oral (!) 108 18 96 %  03/10/18 1922 (!) 150/99 98.5 F (36.9 C) Oral 98 18 98 %  03/10/18 1814 - - - - - 96 %  03/10/18 1556 138/88 98.5 F (36.9 C) Oral 100 18 94 %  03/10/18 1406 - - - - - 95 %  03/10/18 1205 134/81 97.9 F (36.6 C) Oral 92 18 95 %    Intake/Output Summary (Last 24 hours) at 03/11/2018 1152 Last data filed at 03/11/2018 1059 Gross per 24 hour  Intake 3504.91 ml  Output 9500 ml  Net -5995.09 ml    Physical Exam:  General: alert, cooperative and no distress Lochia: appropriate Uterine Fundus: firm Incision: no significant drainage DVT Evaluation: No evidence of DVT seen on physical exam.  Recent Labs    03/09/18 0336 03/10/18 0551  HGB 14.3 13.4  HCT 41.9 39.4   CBG (last 3)  Recent Labs    03/11/18 0640 03/11/18 0705 03/11/18 0945  GLUCAP 55* 79 118*    Assessment/Plan: Status post Cesarean section. Doing well postoperatively.  Started on Enalapril 10 mg daily for BP control On Metformin for DM control Plans Nexplanon for contraception Continue current care. Possible discharge home tomorrow  Jaynie CollinsUgonna Preslyn Warr, MD 03/11/2018, 11:51 AM

## 2018-03-12 DIAGNOSIS — Z98891 History of uterine scar from previous surgery: Secondary | ICD-10-CM

## 2018-03-12 LAB — BPAM RBC
Blood Product Expiration Date: 201912302359
Blood Product Expiration Date: 202001052359
Unit Type and Rh: 9500
Unit Type and Rh: 9500

## 2018-03-12 LAB — TYPE AND SCREEN
ABO/RH(D): B NEG
Antibody Screen: POSITIVE
Unit division: 0
Unit division: 0

## 2018-03-12 LAB — GLUCOSE, CAPILLARY
Glucose-Capillary: 64 mg/dL — ABNORMAL LOW (ref 70–99)
Glucose-Capillary: 55 mg/dL — ABNORMAL LOW (ref 70–99)
Glucose-Capillary: 75 mg/dL (ref 70–99)

## 2018-03-12 MED ORDER — IBUPROFEN 600 MG PO TABS: 600.0000 mg | ORAL_TABLET | Freq: Four times a day (QID) | ORAL | 2 refills | Status: DC | PRN

## 2018-03-12 MED ORDER — OXYCODONE HCL 5 MG PO TABS: 5.0000 mg | ORAL_TABLET | ORAL | 0 refills | Status: DC | PRN

## 2018-03-12 MED ORDER — INSULIN GLARGINE 100 UNIT/ML ~~LOC~~ SOLN: 8.0000 [IU] | Freq: Two times a day (BID) | SUBCUTANEOUS | 11 refills | Status: DC

## 2018-03-12 MED ORDER — METFORMIN HCL 1000 MG PO TABS: 1000.0000 mg | ORAL_TABLET | Freq: Two times a day (BID) | ORAL | 2 refills | Status: DC

## 2018-03-12 MED ORDER — ENALAPRIL MALEATE 10 MG PO TABS: 10.0000 mg | ORAL_TABLET | Freq: Every day | ORAL | 2 refills | Status: DC

## 2018-03-12 MED ORDER — DOCUSATE SODIUM 100 MG PO CAPS: 100.0000 mg | ORAL_CAPSULE | Freq: Two times a day (BID) | ORAL | 2 refills | Status: DC | PRN

## 2018-03-12 NOTE — Discharge Summary (Signed)
Postpartum Discharge Summary     Patient Name: Angela Barry DOB: [birth year] MRN: [id number]  Date of admission: 03/02/2018 Delivering Provider: Adam Phenix   Date of discharge: 03/12/2018  Admitting diagnosis: 36 WKS, CTX Intrauterine pregnancy: [redacted]w[redacted]d     Secondary diagnosis:  Principal Problem:   Hypertension in pregnancy, preeclampsia, severe, delivered Active Problems:   Uncontrolled type 2 diabetes mellitus with hyperglycemia, without long-term current use of insulin (HCC)   Supervision of high risk pregnancy, antepartum   Obesity during pregnancy with antepartum complication   Rh negative state in antepartum period   Encounter for induction of labor   S/P cesarean section for FTP  Additional problems: None     Discharge diagnosis: Preterm Pregnancy Delivered, Preeclampsia (severe) and Type 2 DM                                                                                                Post partum procedures:Magnesium sulfate for eclampsia prophylaxis  Augmentation: Pitocin  Complications: ROM>24 hours and Maternal Fever  Hospital course:  22 y.o. yo G2P0010 at [redacted]w[redacted]d was admitted to the hospital 03/02/2018 for insulin management in setting of poorly controlled T2DM. The Inpatient Diabetic coordinator helped in her glycemic control. The plan was made for inpatient observation and fetal monitoring due to her poor glycemic control. She had reassuring NSTs and BPPs. However, on 03/08/18. patient was noted to develop concerning elevated BPs.  Later that day, she had PPROM and the decision was made to proceed with IOL.  During labor, she was diagnosed with gestational hypertension that progressed to severe preeclampsia , and was started on magnesium sulfate eclampsia prophylaxis that continued until 24 hours after delivery.  During her labor, she had nonreassuring fetal heart rate pattern, cesarean delivery was recommended.  The patient went for cesarean section due to  Non-Reassuring FHR, and delivered a Viable infant,03/09/2018  Membrane Rupture Time/Date: 11:08 PM ,03/08/2018 . Details of operation can be found in separate operative note.  Postpartum, her BP was managed on Enalpril 10 mg daily and her DM was managed on Lantus and Metformin. Patient had an uncomplicated postpartum course. She is ambulating, tolerating a regular diet, passing flatus, and urinating well.  Patient is discharged home in stable condition on 03/12/18.                                   Magnesium Sulfate recieved: Yes BMZ received: Yes  Physical exam  Vitals:   03/11/18 2007 03/11/18 2351 03/12/18 0434 03/12/18 0838  BP: 138/80 (!) 141/89 123/75 (!) 128/92  Pulse: (!) 102 98 88 96  Resp: 17 16 16 16   Temp: 99.3 F (37.4 C) 99.5 F (37.5 C) 97.6 F (36.4 C) 98 F (36.7 C)  TempSrc:  Oral Oral Oral  SpO2: 97% 96% 98% 99%  Weight:      Height:       General: alert, cooperative and no distress Lochia: appropriate Uterine Fundus: firm Incision: No significant erythema, Dressing is clean, dry, and intact DVT  Evaluation: No evidence of DVT seen on physical exam. Negative Homan's sign. No cords or calf tenderness. Labs: Lab Results  Component Value Date   WBC 17.4 (H) 03/10/2018   HGB 13.4 03/10/2018   HCT 39.4 03/10/2018   MCV 90.2 03/10/2018   PLT 180 03/10/2018   CMP Latest Ref Rng & Units 03/10/2018  Glucose 70 - 99 mg/dL -  BUN 6 - 20 mg/dL -  Creatinine 1.61 - 0.96 mg/dL 0.45(W)  Sodium 098 - 119 mmol/L -  Potassium 3.5 - 5.1 mmol/L -  Chloride 98 - 111 mmol/L -  CO2 22 - 32 mmol/L -  Calcium 8.9 - 10.3 mg/dL -  Total Protein 6.5 - 8.1 g/dL -  Total Bilirubin 0.3 - 1.2 mg/dL -  Alkaline Phos 38 - 147 U/L -  AST 15 - 41 U/L -  ALT 0 - 44 U/L -    Discharge instruction: per After Visit Summary and "Baby and Me Booklet".  After visit meds:  Allergies as of 03/12/2018   No Known Allergies     Medication List    STOP taking these medications    BASAGLAR KWIKPEN 100 UNIT/ML Sopn Replaced by:  insulin glargine 100 UNIT/ML injection   glucose blood test strip Commonly known as:  ACCU-CHEK GUIDE   insulin aspart 100 UNIT/ML injection Commonly known as:  novoLOG   Insulin Pen Needle 31G X 4 MM Misc   Insulin Syringe-Needle U-100 29G X 1/2" 0.3 ML Misc Commonly known as:  BD INSULIN SYRINGE   Lancets Misc   metFORMIN 500 MG 24 hr tablet Commonly known as:  GLUCOPHAGE-XR Replaced by:  metFORMIN 1000 MG tablet     TAKE these medications   aspirin EC 81 MG tablet Take 1 tablet (81 mg total) by mouth daily. Take after 12 weeks for prevention of preeclampsia later in pregnancy   docusate sodium 100 MG capsule Commonly known as:  COLACE Take 1 capsule (100 mg total) by mouth 2 (two) times daily as needed for mild constipation or moderate constipation.   enalapril 10 MG tablet Commonly known as:  VASOTEC Take 1 tablet (10 mg total) by mouth daily. Start taking on:  March 13, 2018   ibuprofen 600 MG tablet Commonly known as:  ADVIL,MOTRIN Take 1 tablet (600 mg total) by mouth every 6 (six) hours as needed for headache, mild pain, moderate pain or cramping.   insulin glargine 100 UNIT/ML injection Commonly known as:  LANTUS Inject 0.08 mLs (8 Units total) into the skin 2 (two) times daily. Replaces:  BASAGLAR KWIKPEN 100 UNIT/ML Sopn   metFORMIN 1000 MG tablet Commonly known as:  GLUCOPHAGE Take 1 tablet (1,000 mg total) by mouth 2 (two) times daily with a meal. Replaces:  metFORMIN 500 MG 24 hr tablet   oxyCODONE 5 MG immediate release tablet Commonly known as:  Oxy IR/ROXICODONE Take 1 tablet (5 mg total) by mouth every 4 (four) hours as needed for moderate pain.   PNV PO Take 1 tablet by mouth.   sertraline 50 MG tablet Commonly known as:  ZOLOFT Take 1 tablet (50 mg total) by mouth daily.   Vitamin D (Ergocalciferol) 1.25 MG (50000 UT) Caps capsule Commonly known as:  DRISDOL Take 1 capsule (50,000  Units total) by mouth every 7 (seven) days.            Discharge Care Instructions  (From admission, onward)         Start     Ordered  03/12/18 0000  Discharge wound care:    Comments:  As per discharge handout and nursing instructions   03/12/18 1256          Diet: routine diet  Activity: Advance as tolerated. Pelvic rest for 6 weeks.   Follow up Visit: Follow-up Information    [facility] week([facility]   Specialty:  Obstetrics and Gynecology Why:  BP check and incision check 4 weeks: Postparum Check and Nexplanon placement You will be contacted with appointment details. Contact information: [address], [address] [place] [id number] [phone]           Newborn Data: Live born female  Birth Weight: 8 lb 3.9 oz (3740 g) APGAR: 3, 5  Newborn Delivery   Birth date/time:  03/09/2018 21:52:00 Delivery type:  C-Section, Low Transverse Trial of labor:  Yes C-section categorization:  Primary     Baby Feeding: Breast Disposition:NICU   03/12/2018 Jaynie CollinsUgonna Valora Norell, MD

## 2018-03-12 NOTE — Progress Notes (Signed)
Inpatient Diabetes Program Recommendations  AACE/ADA: New Consensus Statement on Inpatient Glycemic Control (2015)  Target Ranges:  Prepandial:   less than 140 mg/dL      Peak postprandial:   less than 180 mg/dL (1-2 hours)      Critically ill patients:  140 - 180 mg/dL   Lab Results  Component Value Date   GLUCAP 64 (L) 03/12/2018   HGBA1C 7.8 (H) 12/19/2017    Review of Glycemic Control Results for [name], Angela Barry (MRN [id number]) as of 03/12/2018 09:15  Ref. Range 03/11/2018 20:08 03/11/2018 22:25 03/12/2018 08:36  Glucose-Capillary Latest Ref Range: 70 - 99 mg/dL 77 84 64 (L)   Diabetes history:DM Outpatient Diabetes medications:Basaglar 30 units bid, Novolog 18 units tid with meals, Metformin 2000 mg daily with supper Current orders for Inpatient glycemic control:Lantus 8 units BID, Novolog 0-24 units TID & HS, Metformin 1000 mg BID  Inpatient Diabetes Program Recommendations:    If to remain inpatient:   Consider further decreasing Lantus to 12 units QD.   Additionally, consider changing correction for the postpartum patient: Novolog 0-9 units TID &HS under the glycemic control order set.   Thanks, Lujean RaveLauren Syan Cullimore, MSN, RNC-OB Diabetes Coordinator [phone] (8a-5p)

## 2018-03-12 NOTE — Progress Notes (Signed)
Blood sugar 75

## 2018-03-12 NOTE — Progress Notes (Signed)
BS called to MD  Protocol  Followed

## 2018-03-12 NOTE — Lactation Note (Signed)
This note was copied from a baby's chart. Lactation Consultation Note: Follow up visit with this mom of NICU baby. Reports she pumped 3 times yesterday. States she is obtaining very small amounts of Colostrum. Encouraged to pump q 3 hours- 8 times/24 hours. Reviewed engorgement prevention. Has DEBP for home but can not remember what brand. Discussed bringing pump pieces to hospital when visiting baby and pumping here/ No questions at present. To call prn  Patient Name: Angela Court [name] NUUVO'ZToday's Date: 03/12/2018 Reason for consult: Follow-up assessment;Late-preterm 34-36.6wks   Maternal Data Has patient been taught Hand Expression?: Yes Does the patient have breastfeeding experience prior to this delivery?: No  Feeding Feeding Type: Donor Breast Milk  LATCH Score                   Interventions    Lactation Tools Discussed/Used     Consult Status Consult Status: Complete    Pamelia HoitWeeks, Augustina Braddock D 03/12/2018, 8:31 AM

## 2018-03-12 NOTE — Discharge Instructions (Signed)

## 2018-03-13 NOTE — Progress Notes (Signed)
CSW attempted to contact MOB via telephone to obtain permission for a medical procedure for infant.  CSW was unsuccessful utilizing telephone numbers [phone] and [phone].  CSW left voicemail messages with both numbers and requested a return call.    If MOB does not contact CSW within the next 1 hour, CSW will contact non-emergency police to attempt to complete a welfare check at the address that CSW has on file.   Blaine HamperAngel Boyd-Gilyard, MSW, LCSW Clinical Social Work [phone]

## 2018-03-14 ENCOUNTER — Inpatient Hospital Stay (HOSPITAL_COMMUNITY)
Admission: RE | Admit: 2018-03-14 | Discharge: 2018-03-14 | Disposition: A | Discharge status: Home / Self Care | Payer: 59 | Source: Ambulatory Visit | Attending: Family Medicine | Admitting: Family Medicine

## 2018-03-16 ENCOUNTER — Other Ambulatory Visit (HOSPITAL_COMMUNITY): Payer: 59

## 2018-03-19 ENCOUNTER — Ambulatory Visit: Payer: 59

## 2018-03-19 NOTE — Progress Notes (Signed)
Pt is here for BP check and incision check. C/S on 03/09/18. Pt is currently prescribed Aspirin 81mg  and enalapril 10 mg for hypertension. Blood pressure reviewed with midwife, pt advised to continue on current prescriptions. C/S incision is dry and approximated, no drainage, no signs of infection- pt has no complaints and denies any pain or swelling.

## 2018-03-20 ENCOUNTER — Ambulatory Visit: Payer: 59 | Admitting: Endocrinology

## 2018-04-09 ENCOUNTER — Ambulatory Visit (INDEPENDENT_AMBULATORY_CARE_PROVIDER_SITE_OTHER): Payer: 59 | Admitting: Obstetrics & Gynecology

## 2018-04-09 ENCOUNTER — Encounter: Payer: Self-pay | Admitting: Obstetrics & Gynecology

## 2018-04-09 VITALS — BP 132/83 | HR 93 | Ht 62.0 in | Wt 230.0 lb

## 2018-04-09 DIAGNOSIS — Z1389 Encounter for screening for other disorder: Secondary | ICD-10-CM

## 2018-04-09 DIAGNOSIS — Z98891 History of uterine scar from previous surgery: Secondary | ICD-10-CM

## 2018-04-09 NOTE — Progress Notes (Signed)
..  Post Partum Exam  Angela Barry is a 23 y.o. [id number] female who presents for a postpartum visit. She is 4 weeks postpartum following a low cervical transverse Cesarean section. I have fully reviewed the prenatal and intrapartum course. The delivery was at 36.2 gestational weeks.  Anesthesia: spinal. Postpartum course has been good. Baby's course has been good. Baby is feeding by breast. Bleeding no bleeding. Bowel function is normal. Bladder function is normal. Patient is sexually active. Contraception method is none. Postpartum depression screening:neg  The following portions of the patient's history were reviewed and updated as appropriate: allergies, current medications, past family history, past medical history, past social history, past surgical history and problem list. Last pap smear done 08/2017 and was Normal  Review of Systems Pertinent items are noted in HPI.    Objective:  Last menstrual period 06/16/2017, currently breastfeeding.  General:  alert, cooperative and no distress   Breasts:    Lungs:   Heart:    Abdomen: soft, non-tender; bowel sounds normal; no masses,  no organomegaly and incision dry and intact   Vulva:  not evaluated  Vagina: not evaluated                    Assessment:    normal postpartum exam. Pap smear not done at today's visit.   Plan:   1. Contraception: Nexplanon and she needs to abstain and RTC 2 weeks 2. Doing well postop 3. Follow up in: 2 weeks or as needed.   Adam Phenix, MD 04/09/2018

## 2018-04-09 NOTE — Patient Instructions (Signed)
Etonogestrel implant  What is this medicine?  ETONOGESTREL (et oh noe JES trel) is a contraceptive (birth control) device. It is used to prevent pregnancy. It can be used for up to 3 years.  This medicine may be used for other purposes; ask your health care provider or pharmacist if you have questions.  COMMON BRAND NAME(S): Implanon, Nexplanon  What should I tell my health care provider before I take this medicine?  They need to know if you have any of these conditions:  -abnormal vaginal bleeding  -blood vessel disease or blood clots  -breast, cervical, endometrial, ovarian, liver, or uterine cancer  -diabetes  -gallbladder disease  -heart disease or recent heart attack  -high blood pressure  -high cholesterol or triglycerides  -kidney disease  -liver disease  -migraine headaches  -seizures  -stroke  -tobacco smoker  -an unusual or allergic reaction to etonogestrel, anesthetics or antiseptics, other medicines, foods, dyes, or preservatives  -pregnant or trying to get pregnant  -breast-feeding  How should I use this medicine?  This device is inserted just under the skin on the inner side of your upper arm by a health care professional.  Talk to your pediatrician regarding the use of this medicine in children. Special care may be needed.  Overdosage: If you think you have taken too much of this medicine contact a poison control center or emergency room at once.  NOTE: This medicine is only for you. Do not share this medicine with others.  What if I miss a dose?  This does not apply.  What may interact with this medicine?  Do not take this medicine with any of the following medications:  -amprenavir  -fosamprenavir  This medicine may also interact with the following medications:  -acitretin  -aprepitant  -armodafinil  -bexarotene  -bosentan  -carbamazepine  -certain medicines for fungal infections like fluconazole, ketoconazole, itraconazole and voriconazole  -certain medicines to treat hepatitis, HIV or  AIDS  -cyclosporine  -felbamate  -griseofulvin  -lamotrigine  -modafinil  -oxcarbazepine  -phenobarbital  -phenytoin  -primidone  -rifabutin  -rifampin  -rifapentine  -St. John's wort  -topiramate  This list may not describe all possible interactions. Give your health care provider a list of all the medicines, herbs, non-prescription drugs, or dietary supplements you use. Also tell them if you smoke, drink alcohol, or use illegal drugs. Some items may interact with your medicine.  What should I watch for while using this medicine?  This product does not protect you against HIV infection (AIDS) or other sexually transmitted diseases.  You should be able to feel the implant by pressing your fingertips over the skin where it was inserted. Contact your doctor if you cannot feel the implant, and use a non-hormonal birth control method (such as condoms) until your doctor confirms that the implant is in place. Contact your doctor if you think that the implant may have broken or become bent while in your arm.  You will receive a user card from your health care provider after the implant is inserted. The card is a record of the location of the implant in your upper arm and when it should be removed. Keep this card with your health records.  What side effects may I notice from receiving this medicine?  Side effects that you should report to your doctor or health care professional as soon as possible:  -allergic reactions like skin rash, itching or hives, swelling of the face, lips, or tongue  -breast lumps, breast tissue   changes, or discharge  -breathing problems  -changes in emotions or moods  -if you feel that the implant may have broken or bent while in your arm  -high blood pressure  -pain, irritation, swelling, or bruising at the insertion site  -scar at site of insertion  -signs of infection at the insertion site such as fever, and skin redness, pain or discharge  -signs and symptoms of a blood clot such as breathing  problems; changes in vision; chest pain; severe, sudden headache; pain, swelling, warmth in the leg; trouble speaking; sudden numbness or weakness of the face, arm or leg  -signs and symptoms of liver injury like dark yellow or brown urine; general ill feeling or flu-like symptoms; light-colored stools; loss of appetite; nausea; right upper belly pain; unusually weak or tired; yellowing of the eyes or skin  -unusual vaginal bleeding, discharge  Side effects that usually do not require medical attention (report to your doctor or health care professional if they continue or are bothersome):  -acne  -breast pain or tenderness  -headache  -irregular menstrual bleeding  -nausea  This list may not describe all possible side effects. Call your doctor for medical advice about side effects. You may report side effects to FDA at 1-800-FDA-1088.  Where should I keep my medicine?  This drug is given in a hospital or clinic and will not be stored at home.  NOTE: This sheet is a summary. It may not cover all possible information. If you have questions about this medicine, talk to your doctor, pharmacist, or health care provider.   2019 Elsevier/Gold Standard (2017-02-04 14:11:42)

## 2018-04-23 ENCOUNTER — Ambulatory Visit (INDEPENDENT_AMBULATORY_CARE_PROVIDER_SITE_OTHER): Payer: 59 | Admitting: Obstetrics & Gynecology

## 2018-04-23 VITALS — BP 125/81 | HR 109 | Wt 232.0 lb

## 2018-04-23 DIAGNOSIS — Z30017 Encounter for initial prescription of implantable subdermal contraceptive: Secondary | ICD-10-CM

## 2018-04-23 LAB — POCT URINE PREGNANCY: Preg Test, Ur: NEGATIVE

## 2018-04-23 MED ORDER — ETONOGESTREL 68 MG ~~LOC~~ IMPL
68.0000 mg | DRUG_IMPLANT | Freq: Once | SUBCUTANEOUS | Status: AC
  Administered 2018-04-23: 68 mg via SUBCUTANEOUS

## 2018-04-23 NOTE — Progress Notes (Signed)
GYNECOLOGY OFFICE PROCEDURE NOTE  Angela Barry is a 23 y.o. [phone] here for Nexplanon insertion.  Last pap smear was on 09/25/17 and was normal.  No other gynecologic concerns. She abstained for the last 2 weeks and UPT was negative today  Nexplanon Insertion Procedure Patient identified, informed consent performed, consent signed.   Patient does understand that irregular bleeding is a very common side effect of this medication. She was advised to have backup contraception for one week after placement. Pregnancy test in clinic today was negative.  Appropriate time out taken.  Patient's left arm was prepped and draped in the usual sterile fashion. The ruler used to measure and mark insertion area.  Patient was prepped with alcohol swab and then injected with 3 ml of 1% lidocaine.  She was prepped with betadine, Nexplanon removed from packaging,  Device confirmed in needle, then inserted full length of needle and withdrawn per handbook instructions. Nexplanon was able to palpated in the patient's arm; patient palpated the insert herself. There was minimal blood loss.  Patient insertion site covered with gauze and a pressure bandage to reduce any bruising.  The patient tolerated the procedure well and was given post procedure instructions.   Adam Phenix, MD Attending Obstetrician & Gynecologist, [facility]  04/23/2018

## 2018-04-23 NOTE — Patient Instructions (Signed)
Etonogestrel implant  What is this medicine?  ETONOGESTREL (et oh noe JES trel) is a contraceptive (birth control) device. It is used to prevent pregnancy. It can be used for up to 3 years.  This medicine may be used for other purposes; ask your health care provider or pharmacist if you have questions.  COMMON BRAND NAME(S): Implanon, Nexplanon  What should I tell my health care provider before I take this medicine?  They need to know if you have any of these conditions:  -abnormal vaginal bleeding  -blood vessel disease or blood clots  -breast, cervical, endometrial, ovarian, liver, or uterine cancer  -diabetes  -gallbladder disease  -heart disease or recent heart attack  -high blood pressure  -high cholesterol or triglycerides  -kidney disease  -liver disease  -migraine headaches  -seizures  -stroke  -tobacco smoker  -an unusual or allergic reaction to etonogestrel, anesthetics or antiseptics, other medicines, foods, dyes, or preservatives  -pregnant or trying to get pregnant  -breast-feeding  How should I use this medicine?  This device is inserted just under the skin on the inner side of your upper arm by a health care professional.  Talk to your pediatrician regarding the use of this medicine in children. Special care may be needed.  Overdosage: If you think you have taken too much of this medicine contact a poison control center or emergency room at once.  NOTE: This medicine is only for you. Do not share this medicine with others.  What if I miss a dose?  This does not apply.  What may interact with this medicine?  Do not take this medicine with any of the following medications:  -amprenavir  -fosamprenavir  This medicine may also interact with the following medications:  -acitretin  -aprepitant  -armodafinil  -bexarotene  -bosentan  -carbamazepine  -certain medicines for fungal infections like fluconazole, ketoconazole, itraconazole and voriconazole  -certain medicines to treat hepatitis, HIV or  AIDS  -cyclosporine  -felbamate  -griseofulvin  -lamotrigine  -modafinil  -oxcarbazepine  -phenobarbital  -phenytoin  -primidone  -rifabutin  -rifampin  -rifapentine  -St. John's wort  -topiramate  This list may not describe all possible interactions. Give your health care provider a list of all the medicines, herbs, non-prescription drugs, or dietary supplements you use. Also tell them if you smoke, drink alcohol, or use illegal drugs. Some items may interact with your medicine.  What should I watch for while using this medicine?  This product does not protect you against HIV infection (AIDS) or other sexually transmitted diseases.  You should be able to feel the implant by pressing your fingertips over the skin where it was inserted. Contact your doctor if you cannot feel the implant, and use a non-hormonal birth control method (such as condoms) until your doctor confirms that the implant is in place. Contact your doctor if you think that the implant may have broken or become bent while in your arm.  You will receive a user card from your health care provider after the implant is inserted. The card is a record of the location of the implant in your upper arm and when it should be removed. Keep this card with your health records.  What side effects may I notice from receiving this medicine?  Side effects that you should report to your doctor or health care professional as soon as possible:  -allergic reactions like skin rash, itching or hives, swelling of the face, lips, or tongue  -breast lumps, breast tissue   changes, or discharge  -breathing problems  -changes in emotions or moods  -if you feel that the implant may have broken or bent while in your arm  -high blood pressure  -pain, irritation, swelling, or bruising at the insertion site  -scar at site of insertion  -signs of infection at the insertion site such as fever, and skin redness, pain or discharge  -signs and symptoms of a blood clot such as breathing  problems; changes in vision; chest pain; severe, sudden headache; pain, swelling, warmth in the leg; trouble speaking; sudden numbness or weakness of the face, arm or leg  -signs and symptoms of liver injury like dark yellow or brown urine; general ill feeling or flu-like symptoms; light-colored stools; loss of appetite; nausea; right upper belly pain; unusually weak or tired; yellowing of the eyes or skin  -unusual vaginal bleeding, discharge  Side effects that usually do not require medical attention (report to your doctor or health care professional if they continue or are bothersome):  -acne  -breast pain or tenderness  -headache  -irregular menstrual bleeding  -nausea  This list may not describe all possible side effects. Call your doctor for medical advice about side effects. You may report side effects to FDA at 1-800-FDA-1088.  Where should I keep my medicine?  This drug is given in a hospital or clinic and will not be stored at home.  NOTE: This sheet is a summary. It may not cover all possible information. If you have questions about this medicine, talk to your doctor, pharmacist, or health care provider.   2019 Elsevier/Gold Standard (2017-02-04 14:11:42)

## 2018-06-09 ENCOUNTER — Other Ambulatory Visit (HOSPITAL_COMMUNITY): Payer: Self-pay | Admitting: Obstetrics & Gynecology

## 2018-06-16 ENCOUNTER — Telehealth: Payer: Self-pay

## 2018-06-16 ENCOUNTER — Encounter (HOSPITAL_COMMUNITY): Payer: Self-pay | Admitting: Emergency Medicine

## 2018-06-16 ENCOUNTER — Emergency Department (HOSPITAL_COMMUNITY)
Admission: EM | Admit: 2018-06-16 | Discharge: 2018-06-17 | Disposition: A | Discharge status: Other Acute Inpatient Hospital | Payer: 59 | Attending: Emergency Medicine | Admitting: Emergency Medicine

## 2018-06-16 DIAGNOSIS — R45851 Suicidal ideations: Secondary | ICD-10-CM | POA: Insufficient documentation

## 2018-06-16 DIAGNOSIS — F329 Major depressive disorder, single episode, unspecified: Secondary | ICD-10-CM | POA: Diagnosis present

## 2018-06-16 DIAGNOSIS — Z79899 Other long term (current) drug therapy: Secondary | ICD-10-CM | POA: Insufficient documentation

## 2018-06-16 DIAGNOSIS — F332 Major depressive disorder, recurrent severe without psychotic features: Secondary | ICD-10-CM | POA: Diagnosis present

## 2018-06-16 DIAGNOSIS — E1165 Type 2 diabetes mellitus with hyperglycemia: Secondary | ICD-10-CM | POA: Insufficient documentation

## 2018-06-16 DIAGNOSIS — F53 Postpartum depression: Secondary | ICD-10-CM | POA: Insufficient documentation

## 2018-06-16 DIAGNOSIS — R739 Hyperglycemia, unspecified: Secondary | ICD-10-CM

## 2018-06-16 LAB — CBC WITH DIFFERENTIAL/PLATELET
Abs Immature Granulocytes: 0.01 10*3/uL (ref 0.00–0.07)
Basophils Absolute: 0 10*3/uL (ref 0.0–0.1)
Basophils Relative: 1 %
Eosinophils Absolute: 0.1 10*3/uL (ref 0.0–0.5)
Eosinophils Relative: 2 %
HCT: 45.5 % (ref 36.0–46.0)
Hemoglobin: 15.6 g/dL — ABNORMAL HIGH (ref 12.0–15.0)
Immature Granulocytes: 0 %
Lymphocytes Relative: 40 %
Lymphs Abs: 2.2 10*3/uL (ref 0.7–4.0)
MCH: 29.5 pg (ref 26.0–34.0)
MCHC: 34.3 g/dL (ref 30.0–36.0)
MCV: 86 fL (ref 80.0–100.0)
Monocytes Absolute: 0.3 10*3/uL (ref 0.1–1.0)
Monocytes Relative: 5 %
Neutro Abs: 2.8 10*3/uL (ref 1.7–7.7)
Neutrophils Relative %: 52 %
Platelets: 268 10*3/uL (ref 150–400)
RBC: 5.29 MIL/uL — ABNORMAL HIGH (ref 3.87–5.11)
RDW: 12.3 % (ref 11.5–15.5)
WBC: 5.4 10*3/uL (ref 4.0–10.5)
nRBC: 0 % (ref 0.0–0.2)

## 2018-06-16 LAB — SALICYLATE LEVEL: Salicylate Lvl: <7 mg/dL (ref 2.8–30.0)

## 2018-06-16 LAB — ACETAMINOPHEN LEVEL: Acetaminophen (Tylenol), Serum: <10 ug/mL — ABNORMAL LOW (ref 10–30)

## 2018-06-16 LAB — COMPREHENSIVE METABOLIC PANEL
ALT: 15 U/L (ref 0–44)
AST: 18 U/L (ref 15–41)
Albumin: 4.7 g/dL (ref 3.5–5.0)
Alkaline Phosphatase: 92 U/L (ref 38–126)
Anion gap: 14 (ref 5–15)
BUN: 10 mg/dL (ref 6–20)
CO2: 22 mmol/L (ref 22–32)
Calcium: 10 mg/dL (ref 8.9–10.3)
Chloride: 100 mmol/L (ref 98–111)
Creatinine, Ser: 0.83 mg/dL (ref 0.44–1.00)
GFR calc Af Amer: >60 mL/min (ref >60)
GFR calc non Af Amer: >60 mL/min (ref >60)
Glucose, Bld: 419 mg/dL — ABNORMAL HIGH (ref 70–99)
Potassium: 3.8 mmol/L (ref 3.5–5.1)
Sodium: 136 mmol/L (ref 135–145)
Total Bilirubin: 0.7 mg/dL (ref 0.3–1.2)
Total Protein: 8.5 g/dL — ABNORMAL HIGH (ref 6.5–8.1)

## 2018-06-16 LAB — RAPID URINE DRUG SCREEN, HOSP PERFORMED
Amphetamines: NOT DETECTED
Barbiturates: NOT DETECTED
Benzodiazepines: NOT DETECTED
Cocaine: NOT DETECTED
Opiates: NOT DETECTED
Tetrahydrocannabinol: NOT DETECTED

## 2018-06-16 LAB — I-STAT BETA HCG BLOOD, ED (MC, WL, AP ONLY): I-stat hCG, quantitative: <5 m[IU]/mL (ref <5)

## 2018-06-16 LAB — ETHANOL: Alcohol, Ethyl (B): <10 mg/dL (ref <10)

## 2018-06-16 MED ORDER — HYDROXYZINE HCL 25 MG PO TABS
25.0000 mg | ORAL_TABLET | Freq: Three times a day (TID) | ORAL | Status: DC | PRN
  Filled 2018-06-16: qty 1

## 2018-06-16 MED ORDER — SODIUM CHLORIDE 0.9 % IV BOLUS
1000.0000 mL | Freq: Once | INTRAVENOUS | Status: AC
  Administered 2018-06-16: 1000 mL via INTRAVENOUS

## 2018-06-16 MED ORDER — SERTRALINE HCL 50 MG PO TABS
25.0000 mg | ORAL_TABLET | Freq: Every day | ORAL | Status: DC
  Administered 2018-06-16 – 2018-06-17 (×2): 25 mg via ORAL
  Filled 2018-06-16 (×3): qty 1

## 2018-06-16 NOTE — BH Assessment (Addendum)
Tele Assessment Note   Patient Name: Angela Barry MRN: [id number] Referring Physician: Bennye Alm Location of Patient: [facility] Location of Provider: Behavioral Health TTS Department  Angela Barry is a 23 y.o. female who presents voluntarily to [facility]. Pt initially presented to [facility] care reporting SI & was advised to go to [facility]. Pt is accompanied by her 92 month old son & her college friend, Angela Barry. Pt is reporting symptoms of depression and suicidal ideation. Pt reports she has had Depression with SI off and on for years. She had just started on an antidepressant, rx'ed by [employer] student health, when she learned she was pregnant. She discontinued the Zoloft at that time. Pt is again feeling increase in Depression sx: isolating, anhedonia, feelings of worthlessness & guilt, tearfulness & irritability. Pt reports current suicidal ideation with plans of an intentional MVA. Past attempts include none. Pt denies homicidal ideation/ history of violence. Pt denies AVH and other psychotic symptoms. Pt states current stressors include return of SI.  Pt lives with her boyfrined, and supports include her friend, Angela Barry, & her parents. Pt denies hx of abuse and trauma. She reports she did intentionally cut on herself at 23 years old. No tx at that time or explanation given by pt for the cutting. Pt reports there is family hx of social anxiety- her sister. Pt is currently a senior at [employer].  Pt has good insight and judgment. Pt's memory is intact. Legal history includes no charges. ? Pt's OP history includes no tx. IP history includes no tx. Pt reports occasional-to-rare social alcohol use. No other drug abuse. ? MSE: Pt is dressed in scrubs, alert, oriented x4 with soft speech and normal motor behavior. Eye contact is good. Pt's mood is depressed, apprehensive and pleasant and affect is depressed and appropriate to circumstance. Affect is congruent with mood. Thought process is coherent and  relevant. There is no indication pt is currently responding to internal stimuli or experiencing delusional thought content. Pt was cooperative throughout assessment.   Diagnosis: F53 postpartum depression Disposition: Malachy Chamber, NP, recommends pt start on Zoloft in ED, remain overnight for observation, safety & stabilization, be enrolled in IOP/Partial in morning & be discharged with Zoloft Rx.  Past Medical History:  Past Medical History:  Diagnosis Date  . Diabetes mellitus without complication [facility])     Past Surgical History:  Procedure Laterality Date  . CESAREAN SECTION N/A 03/09/2018   Procedure: CESAREAN SECTION;  Surgeon: Adam Phenix, MD;  Location: [facility] BIRTHING SUITES;  Service: Obstetrics;  Laterality: N/A;  . NO PAST SURGERIES      Family History:  Family History  Problem Relation Age of Onset  . Diabetes Father   . Diabetes Paternal Grandmother   . Diabetes Paternal Grandfather   . Hypertension Neg Hx     Social History:  reports that she has never smoked. She has never used smokeless tobacco. She reports previous alcohol use. She reports that she does not use drugs.  Additional Social History:  Alcohol / Drug Use Pain Medications: See MAR Prescriptions: none reported Over the Counter: See MAR History of alcohol / drug use?: No history of alcohol / drug abuse  CIWA: CIWA-Ar BP: (!) 148/103 Pulse Rate: (!) 120 COWS:    Allergies: No Known Allergies  Home Medications: (Not in a hospital admission)   OB/GYN Status:  No LMP recorded.  General Assessment Data Location of Assessment: [facility] TTS Assessment: In system Is this a Tele or  Face-to-Face Assessment?: Face-to-Face Is this an Initial Assessment or a Re-assessment for this encounter?: Initial Assessment Patient Accompanied by:: Other(friend, Angela Barry) Language Other than English: No Living Arrangements: Other (Comment) What gender do you identify as?: Female Marital status: Long term  relationship Pregnancy Status: No Living Arrangements: Spouse/significant other, Children(43 month old son) Can pt return to current living arrangement?: Yes Admission Status: Voluntary Is patient capable of signing voluntary admission?: Yes Referral Source: Other([facility]) Insurance type: Wellsite ge[employer] Care Plan Living Arrangements: Spouse/significant other, Children(22 month old son) Name of Psychiatrist: none(Briefly saw [employer] psychiatrist for med rx last year) Name of Therapist: none  Education Status Is patient currently in school?: Yes Current Grade: Senior([employer]) Highest grade of school patient has completed: jr Name of school: [employer]  Risk to self with the past 6 months Suicidal Ideation: Yes-Currently Present Has patient been a risk to self within the past 6 months prior to admission? : No Suicidal Intent: No-Not Currently/Within Last 6 Months Has patient had any suicidal intent within the past 6 months prior to admission? : No Is patient at risk for suicide?: Yes Suicidal Plan?: Yes-Currently Present Has patient had any suicidal plan within the past 6 months prior to admission? : Yes Specify Current Suicidal Plan: intentional mva Access to Means: Yes What has been your use of drugs/alcohol within the last 12 months?: none Previous Attempts/Gestures: No Other Self Harm Risks: postpartum Intentional Self Injurious Behavior: Cutting(at 23 years old- no reason why) Family Suicide History: No Recent stressful life event(s): Other (Comment)(1st baby) Persecutory voices/beliefs?: No Depression: Yes Depression Symptoms: Despondent, Insomnia, Tearfulness, Isolating, Fatigue, Guilt, Loss of interest in usual pleasures, Feeling worthless/self pity Substance abuse history and/or treatment for substance abuse?: No Suicide prevention information given to non-admitted patients: Not applicable  Risk to Others within the past 6 months Homicidal Ideation: No Does patient  have any lifetime risk of violence toward others beyond the six months prior to admission? : No Thoughts of Harm to Others: No Current Homicidal Intent: No Current Homicidal Plan: No Access to Homicidal Means: No History of harm to others?: No Assessment of Violence: None Noted Does patient have access to weapons?: No Criminal Charges Pending?: No Does patient have a court date: No Is patient on probation?: No  Psychosis Hallucinations: None noted Delusions: None noted  Mental Status Report Appearance/Hygiene: Unremarkable Eye Contact: Good Motor Activity: Freedom of movement Speech: Logical/coherent, Soft Level of Consciousness: Alert Mood: Pleasant, Depressed, Apprehensive Affect: Constricted, Sad Anxiety Level: Minimal Thought Processes: Coherent, Relevant Judgement: Unimpaired Orientation: Person, Place, Time, Situation Obsessive Compulsive Thoughts/Behaviors: None  Cognitive Functioning Concentration: Normal Memory: Recent Intact, Remote Intact Is patient IDD: No Insight: Good Impulse Control: Good Appetite: Good Have you had any weight changes? : (had a baby) Sleep: Decreased Total Hours of Sleep: 6 Vegetative Symptoms: None  ADLScreening [facility] Assessment Services) Patient's cognitive ability adequate to safely complete daily activities?: Yes Patient able to express need for assistance with ADLs?: Yes Independently performs ADLs?: Yes (appropriate for developmental age)  Prior Inpatient Therapy Prior Inpatient Therapy: No  Prior Outpatient Therapy Prior Outpatient Therapy: No Does patient have an ACCT team?: No Does patient have Intensive In-House Services?  : No Does patient have [facility] services? : No Does patient have P4CC services?: No  ADL Screening (condition at time of admission) Patient's cognitive ability adequate to safely complete daily activities?: Yes Is the patient deaf or have difficulty hearing?: No Does the patient have  difficulty  seeing, even when wearing glasses/contacts?: No Does the patient have difficulty concentrating, remembering, or making decisions?: No Patient able to express need for assistance with ADLs?: Yes Does the patient have difficulty dressing or bathing?: No Independently performs ADLs?: Yes (appropriate for developmental age) Does the patient have difficulty walking or climbing stairs?: No Weakness of Legs: None Weakness of Arms/Hands: None  Home Assistive Devices/Equipment Home Assistive Devices/Equipment: None  Therapy Consults (therapy consults require a physician order) PT Evaluation Needed: No OT Evalulation Needed: No SLP Evaluation Needed: No Abuse/Neglect Assessment (Assessment to be complete while patient is alone) Abuse/Neglect Assessment Can Be Completed: Yes Physical Abuse: Denies Verbal Abuse: Denies Sexual Abuse: Denies Exploitation of patient/patient's resources: Denies Self-Neglect: Denies Values / Beliefs Cultural Requests During Hospitalization: None Spiritual Requests During Hospitalization: None Consults Spiritual Care Consult Needed: No Social Work Consult Needed: No Merchant navy officer (For Healthcare) Does Patient Have a Medical Advance Directive?: No          Disposition: Malachy Chamber, NP, recommends pt start on Zoloft in ED, remain overnight for observation, safety & stabilization, be enrolled in IOP/Partial in morning & be discharged with Zoloft Rx. Disposition Initial Assessment Completed for this Encounter: Yes  This service was provided via telemedicine using a 2-way, interactive audio and video technology.    Lang Zingg Suzan Nailer 06/16/2018 6:33 PM

## 2018-06-16 NOTE — ED Notes (Signed)
Bed: NW29 Expected date: 06/16/18 Expected time:  Means of arrival:  Comments: In ED for fluids will return

## 2018-06-16 NOTE — ED Notes (Signed)
Attempted to start PIV x2 without success. 2nd RN to attempt at this time.

## 2018-06-16 NOTE — ED Notes (Signed)
At start of shift, new orders for lab to be drawn and to get an IV. Called charge nurse re IV and writer is not competent with IV. She will be moved along with her sitter to room 22 in the ED.

## 2018-06-16 NOTE — ED Notes (Signed)
Bed: XO32 Expected date:  Expected time:  Means of arrival:  Comments: rm 33

## 2018-06-16 NOTE — ED Triage Notes (Signed)
Patient here from home with complaints of suicidal ideation for years. Reports that she was placed on Zoloft in April and found out she was pregnant so stop taking it. Reports "life" is getting the best of her.

## 2018-06-16 NOTE — ED Provider Notes (Signed)
[facility]-EMERGENCY DEPT Provider Note   CSN: [id number] Arrival date & time: 06/16/18  1638    History   Chief Complaint Chief Complaint  Patient presents with  . Suicidal    HPI Cearia Harnack is a 23 y.o. female with history of diabetes mellitus presents for evaluation of gradual onset, progressively worsening suicidal ideations for the last few months.  She reports that she has had intermittent suicidal thoughts for "a long time now "but it got worse after giving birth 3 months ago. Endorses increases crying spells. She does endorse a plan but will not elaborate when asked.  She denies homicidal ideation, auditory or visual hallucinations.  She denies any cigarette smoking, alcohol use, or recreational drug use. Denies any medical complaints otherwise.      The history is provided by the patient.    Past Medical History:  Diagnosis Date  . Diabetes mellitus without complication [facility])     Patient Active Problem List   Diagnosis Date Noted  . Vitamin D deficiency 10/07/2017  . Uncontrolled type 2 diabetes mellitus with hyperglycemia, without long-term current use of insulin (HCC) 09/05/2017    Past Surgical History:  Procedure Laterality Date  . CESAREAN SECTION N/A 03/09/2018   Procedure: CESAREAN SECTION;  Surgeon: Adam Phenix, MD;  Location: [facility] BIRTHING SUITES;  Service: Obstetrics;  Laterality: N/A;  . NO PAST SURGERIES       OB History    Gravida  2   Para  1   Term      Preterm  1   AB  1   Living  1     SAB  1   TAB      Ectopic      Multiple      Live Births  1            Home Medications    Prior to Admission medications   Medication Sig Start Date End Date Taking? Authorizing Provider  cetirizine (ZYRTEC) 10 MG tablet Take 10 mg by mouth daily.   Yes [provider]  etonogestrel (NEXPLANON) 68 MG IMPL implant 1 each by Subdermal route once.   Yes [provider]  ibuprofen  (ADVIL,MOTRIN) 200 MG tablet Take 400 mg by mouth daily as needed.   Yes [provider]  sertraline (ZOLOFT) 50 MG tablet Take 50 mg by mouth daily.    Yes [provider]  docusate sodium (COLACE) 100 MG capsule Take 1 capsule (100 mg total) by mouth 2 (two) times daily as needed for mild constipation or moderate constipation. Patient not taking: Reported on 04/23/2018 03/12/18   Tereso Newcomer, MD  sertraline (ZOLOFT) 50 MG tablet Take 1 tablet (50 mg total) by mouth daily. Patient not taking: Reported on 04/23/2018 12/11/17   Constant, Peggy, MD  Vitamin D, Ergocalciferol, (DRISDOL) 50000 units CAPS capsule Take 1 capsule (50,000 Units total) by mouth every 7 (seven) days. Patient not taking: Reported on 04/23/2018 10/07/17   Roe Coombs, CNM    Family History Family History  Problem Relation Age of Onset  . Diabetes Father   . Diabetes Paternal Grandmother   . Diabetes Paternal Grandfather   . Hypertension Neg Hx     Social History Social History   Tobacco Use  . Smoking status: Never Smoker  . Smokeless tobacco: Never Used  Substance Use Topics  . Alcohol use: Not Currently  . Drug use: Never     Allergies  Patient has no known allergies.   Review of Systems Review of Systems  Constitutional: Negative for chills and fever.  Cardiovascular: Negative for chest pain.  Gastrointestinal: Negative for abdominal pain, nausea and vomiting.  Psychiatric/Behavioral: Positive for suicidal ideas.  All other systems reviewed and are negative.    Physical Exam Updated Vital Signs BP (!) 151/89 (BP Location: Right Arm)   Pulse 97   Temp 98.3 F (36.8 C) (Oral)   Resp 16   SpO2 98%   Physical Exam Vitals signs and nursing note reviewed.  Constitutional:      General: She is not in acute distress.    Appearance: She is well-developed.  HENT:     Head: Normocephalic and atraumatic.  Eyes:     General:        Right eye: No discharge.         Left eye: No discharge.     Conjunctiva/sclera: Conjunctivae normal.  Neck:     Vascular: No JVD.     Trachea: No tracheal deviation.  Cardiovascular:     Rate and Rhythm: Regular rhythm. Tachycardia present.     Pulses: Normal pulses.     Heart sounds: Normal heart sounds.  Pulmonary:     Effort: Pulmonary effort is normal.     Breath sounds: Normal breath sounds.  Abdominal:     General: Bowel sounds are normal. There is no distension.     Palpations: Abdomen is soft.     Tenderness: There is no abdominal tenderness. There is no guarding or rebound.     Hernia: No hernia is present.     Comments: Well-healed C-section scar with no erythema, induration, or drainage  Skin:    Findings: No erythema.  Neurological:     Mental Status: She is alert.  Psychiatric:        Mood and Affect: Mood is anxious and depressed.        Speech: Speech normal.        Thought Content: Thought content includes suicidal ideation. Thought content does not include homicidal ideation. Thought content includes suicidal plan. Thought content does not include homicidal plan.     Comments: Does not appear to be responding to internal stimuli at this time      ED Treatments / Results  Labs (all labs ordered are listed, but only abnormal results are displayed) Labs Reviewed  COMPREHENSIVE METABOLIC PANEL - Abnormal; Notable for the following components:      Result Value   Glucose, Bld 419 (*)    Total Protein 8.5 (*)    All other components within normal limits  CBC WITH DIFFERENTIAL/PLATELET - Abnormal; Notable for the following components:   RBC 5.29 (*)    Hemoglobin 15.6 (*)    All other components within normal limits  ACETAMINOPHEN LEVEL - Abnormal; Notable for the following components:   Acetaminophen (Tylenol), Serum <10 (*)    All other components within normal limits  ETHANOL  RAPID URINE DRUG SCREEN, HOSP PERFORMED  SALICYLATE LEVEL  I-STAT BETA HCG BLOOD, ED ([facility], [facility], [facility] ONLY)     EKG None  Radiology No results found.  Procedures Procedures (including critical care time)  Medications Ordered in ED Medications  sertraline (ZOLOFT) tablet 25 mg (25 mg Oral Given 06/16/18 2019)  hydrOXYzine (ATARAX/VISTARIL) tablet 25 mg (has no administration in time range)  sodium chloride 0.9 % bolus 1,000 mL (0 mLs Intravenous Stopped 06/16/18 2127)     Initial Impression / Assessment and  Plan / ED Course  I have reviewed the triage vital signs and the nursing notes.  Pertinent labs & imaging results that were available during my care of the patient were reviewed by me and considered in my medical decision making (see chart for details).         Patient presenting for evaluation of suicidal ideations.  She is 3 months postpartum.  She is originally tachycardic, mildly hypertensive.  Vital signs otherwise stable.  She is nontoxic in appearance.  Physical examination is reassuring, screening labs suggest dehydration with elevated hemoglobin and hyperglycemia.  She received a liter of IV fluids with improvement in her tachycardia.  Remainder of lab work reviewed by me is unremarkable.  TTS recommends overnight observation, initiation of Zoloft, reevaluation in the morning to ensure safety and stabilization.  Of note, patient is here voluntarily and may require IVC if she attempts leave prior to their assessment in the morning.  Final Clinical Impressions(s) / ED Diagnoses   Final diagnoses:  Suicidal ideation  Hyperglycemia    ED Discharge Orders    None       Bennye AlmFawze, Andreia Gandolfi A, PA-C 06/16/18 2317    Linwood DibblesKnapp, Jon, MD 06/17/18 1511

## 2018-06-16 NOTE — Telephone Encounter (Signed)
Pt walked in today c/o pp depression s/p c/s 03/09/18. Pt is having suicidal thoughts. Pt advised to be seen at [facility].  Offered to call ambulance but she declined and left. I called pt to follow up. She made it safe to [facility] confirmed with the receptionist. Advised receptionist do not allow pt to leave the lobby.

## 2018-06-16 NOTE — ED Notes (Signed)
Returned to unit. She is able to contract for safety at this time. Bright affect, appropriate conversation and pleasant. Anticipating discharge in am. Will continue to monitor for safety.

## 2018-06-16 NOTE — ED Notes (Signed)
Returned to unit bed 33 after getting 1000 ml of fluids and she also was started on Zoloft. She states she does feel better.

## 2018-06-16 NOTE — BH Assessment (Signed)
Ernst Spell, RN of pt's discharge plans for tomorrow and Zoloft rx to be started this evening per Malachy Chamber, NP.

## 2018-06-16 NOTE — ED Notes (Signed)
Pt escorted back to the TCU at this time. Sitter present at bedside. Belongings placed in locker #33. IV left in place at this time as a precaution. RN of TCU notified.

## 2018-06-17 ENCOUNTER — Inpatient Hospital Stay (HOSPITAL_COMMUNITY)
Admission: AD | Admit: 2018-06-17 | Discharge: 2018-06-19 | DRG: 776 | Disposition: A | Discharge status: Home / Self Care | Payer: 59 | Source: Intra-hospital | Attending: Psychiatry | Admitting: Psychiatry

## 2018-06-17 ENCOUNTER — Other Ambulatory Visit: Payer: Self-pay

## 2018-06-17 ENCOUNTER — Encounter (HOSPITAL_COMMUNITY): Payer: Self-pay | Admitting: *Deleted

## 2018-06-17 DIAGNOSIS — O99345 Other mental disorders complicating the puerperium: Secondary | ICD-10-CM | POA: Diagnosis present

## 2018-06-17 DIAGNOSIS — F332 Major depressive disorder, recurrent severe without psychotic features: Secondary | ICD-10-CM | POA: Diagnosis present

## 2018-06-17 DIAGNOSIS — F410 Panic disorder [episodic paroxysmal anxiety] without agoraphobia: Secondary | ICD-10-CM | POA: Diagnosis present

## 2018-06-17 DIAGNOSIS — E119 Type 2 diabetes mellitus without complications: Secondary | ICD-10-CM | POA: Diagnosis present

## 2018-06-17 DIAGNOSIS — Z915 Personal history of self-harm: Secondary | ICD-10-CM | POA: Diagnosis not present

## 2018-06-17 DIAGNOSIS — Z791 Long term (current) use of non-steroidal anti-inflammatories (NSAID): Secondary | ICD-10-CM

## 2018-06-17 DIAGNOSIS — Z79899 Other long term (current) drug therapy: Secondary | ICD-10-CM

## 2018-06-17 DIAGNOSIS — F53 Postpartum depression: Secondary | ICD-10-CM | POA: Diagnosis present

## 2018-06-17 DIAGNOSIS — Z818 Family history of other mental and behavioral disorders: Secondary | ICD-10-CM | POA: Diagnosis not present

## 2018-06-17 DIAGNOSIS — F329 Major depressive disorder, single episode, unspecified: Secondary | ICD-10-CM | POA: Insufficient documentation

## 2018-06-17 DIAGNOSIS — R45851 Suicidal ideations: Secondary | ICD-10-CM | POA: Diagnosis present

## 2018-06-17 DIAGNOSIS — Z833 Family history of diabetes mellitus: Secondary | ICD-10-CM

## 2018-06-17 LAB — CBG MONITORING, ED
GLUCOSE-CAPILLARY: 341 mg/dL — AB (ref 70–99)
Glucose-Capillary: 307 mg/dL — ABNORMAL HIGH (ref 70–99)
Glucose-Capillary: 354 mg/dL — ABNORMAL HIGH (ref 70–99)

## 2018-06-17 LAB — GLUCOSE, CAPILLARY
GLUCOSE-CAPILLARY: 355 mg/dL — AB (ref 70–99)
Glucose-Capillary: 251 mg/dL — ABNORMAL HIGH (ref 70–99)

## 2018-06-17 MED ORDER — INSULIN ASPART 100 UNIT/ML ~~LOC~~ SOLN
0.0000 [IU] | Freq: Three times a day (TID) | SUBCUTANEOUS | Status: DC
  Administered 2018-06-17 – 2018-06-18 (×3): 15 [IU] via SUBCUTANEOUS
  Administered 2018-06-18: 5 [IU] via SUBCUTANEOUS
  Administered 2018-06-19: 8 [IU] via SUBCUTANEOUS

## 2018-06-17 MED ORDER — FLUOXETINE HCL 10 MG PO CAPS
10.0000 mg | ORAL_CAPSULE | Freq: Every day | ORAL | Status: DC
  Administered 2018-06-18 – 2018-06-19 (×2): 10 mg via ORAL
  Filled 2018-06-17 (×3): qty 1

## 2018-06-17 MED ORDER — INSULIN ASPART 100 UNIT/ML ~~LOC~~ SOLN
0.0000 [IU] | Freq: Every day | SUBCUTANEOUS | Status: DC
  Administered 2018-06-17: 3 [IU] via SUBCUTANEOUS
  Administered 2018-06-18: 4 [IU] via SUBCUTANEOUS

## 2018-06-17 MED ORDER — INSULIN GLARGINE 100 UNIT/ML ~~LOC~~ SOLN
10.0000 [IU] | Freq: Every day | SUBCUTANEOUS | Status: DC
  Administered 2018-06-17: 10 [IU] via SUBCUTANEOUS

## 2018-06-17 MED ORDER — MAGNESIUM HYDROXIDE 400 MG/5ML PO SUSP: 30.0000 mL | Freq: Every day | ORAL | Status: DC | PRN

## 2018-06-17 MED ORDER — SERTRALINE HCL 25 MG PO TABS
25.0000 mg | ORAL_TABLET | Freq: Every day | ORAL | Status: DC
  Filled 2018-06-17 (×2): qty 1

## 2018-06-17 MED ORDER — HYDROXYZINE HCL 25 MG PO TABS
25.0000 mg | ORAL_TABLET | Freq: Three times a day (TID) | ORAL | Status: DC | PRN
  Administered 2018-06-17 – 2018-06-18 (×2): 25 mg via ORAL
  Filled 2018-06-17 (×2): qty 1

## 2018-06-17 MED ORDER — ALUM & MAG HYDROXIDE-SIMETH 200-200-20 MG/5ML PO SUSP: 30.0000 mL | ORAL | Status: DC | PRN

## 2018-06-17 MED ORDER — METFORMIN HCL 500 MG PO TABS
500.0000 mg | ORAL_TABLET | Freq: Two times a day (BID) | ORAL | Status: DC
  Administered 2018-06-17 – 2018-06-19 (×4): 500 mg via ORAL
  Filled 2018-06-17 (×8): qty 1

## 2018-06-17 MED ORDER — ACETAMINOPHEN 325 MG PO TABS: 650.0000 mg | ORAL_TABLET | Freq: Four times a day (QID) | ORAL | Status: DC | PRN

## 2018-06-17 MED ORDER — METFORMIN HCL 500 MG PO TABS
500.0000 mg | ORAL_TABLET | Freq: Two times a day (BID) | ORAL | Status: DC
  Administered 2018-06-17: 500 mg via ORAL
  Filled 2018-06-17: qty 1

## 2018-06-17 MED ORDER — TRAZODONE HCL 50 MG PO TABS
50.0000 mg | ORAL_TABLET | Freq: Every evening | ORAL | Status: DC | PRN
  Administered 2018-06-17 – 2018-06-18 (×2): 50 mg via ORAL
  Filled 2018-06-17 (×2): qty 1
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
## 2018-06-17 NOTE — H&P (Addendum)
Psychiatric Admission Assessment Adult  Patient Identification: Angela Barry MRN:  [id number] Date of Evaluation:  06/17/2018 Chief Complaint:  MDD POSTPARTUM DEPRESSION Principal Diagnosis: MDD (major depressive disorder), recurrent severe, without psychosis ([place]) Diagnosis:  Principal Problem:   MDD (major depressive disorder), recurrent severe, without psychosis ([place])  History of Present Illness: From admission SRA: 23, single, lives with BF and son,  has a 4 old child, who is currently with patient's mother. 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 Patient presented to ED voluntarily on 3/17 due to worsening depression. States she has been feeling more depressed recently, and cannot identify any specific triggers. Reports that over recent days she has had some intermittent suicidal ideations, with thoughts of crashing her car. She denies any psychotic symptoms. She states she has been feeling more anxious recently, mainly about the coronavirus epidemic. She has a child in December 2019 and is 3 months postpartum. Reports some neuro-vegetative symptoms- identifies sadness, some decreased energy, but describes normal appetite and denies anhedonia. She states she is not currently breastfeeding. Denies thoughts of harming her son.  Associated Signs/Symptoms: Depression Symptoms:  depressed mood, fatigue, suicidal thoughts with specific plan, anxiety, (Hypo) Manic Symptoms:  denies Anxiety Symptoms:  Excessive Worry, Psychotic Symptoms:  denies PTSD Symptoms: Negative Total Time spent with patient: 45 minutes  Past Psychiatric History: Prior history of depression, previously treated briefly with Zoloft through [facility]. She stopped the Zoloft when she found out she was pregnant. History of cutting, last done years ago in high school. History of panic attacks years ago. Denies history of suicide attempts, hospitalizations, mania or psychosis. Denies  history of violence. Denies history of alcohol or drug abuse.  Is the patient at risk to self? Yes.    Has the patient been a risk to self in the past 6 months? No.  Has the patient been a risk to self within the distant past? Yes.    Is the patient a risk to others? No.  Has the patient been a risk to others in the past 6 months? No.  Has the patient been a risk to others within the distant past? No.   Prior Inpatient Therapy:   Prior Outpatient Therapy:    Alcohol Screening: 1. How often do you have a drink containing alcohol?: Never 2. How many drinks containing alcohol do you have on a typical day when you are drinking?: 1 or 2 3. How often do you have six or more drinks on one occasion?: Never AUDIT-C Score: 0 4. How often during the last year have you found that you were not able to stop drinking once you had started?: Never 5. How often during the last year have you failed to do what was normally expected from you becasue of drinking?: Never 6. How often during the last year have you needed a first drink in the morning to get yourself going after a heavy drinking session?: Never 7. How often during the last year have you had a feeling of guilt of remorse after drinking?: Never 8. How often during the last year have you been unable to remember what happened the night before because you had been drinking?: Never 9. Have you or someone else been injured as a result of your drinking?: No 10. Has a relative or friend or a doctor or another health worker been concerned about your drinking or suggested you cut down?: No Alcohol Use Disorder Identification Test Final Score (AUDIT): 0  Alcohol Brief Interventions/Follow-up: AUDIT Score <7 follow-up not indicated Substance Abuse History in the last 12 months:  No. Consequences of Substance Abuse: NA Previous Psychotropic Medications: Yes  Psychological Evaluations: No  Past Medical History:  Past Medical History:  Diagnosis Date  .  Diabetes mellitus without complication [facility])     Past Surgical History:  Procedure Laterality Date  . CESAREAN SECTION N/A 03/09/2018   Procedure: CESAREAN SECTION;  Surgeon: Woodroe Mode, MD;  Location: [facility];  Service: Obstetrics;  Laterality: N/A;  . NO PAST SURGERIES     Family History:  Family History  Problem Relation Age of Onset  . Diabetes Father   . Diabetes Paternal Grandmother   . Diabetes Paternal Grandfather   . Hypertension Neg Hx    Family Psychiatric  History: Sister with social anxiety. Denies family history of suicides or alcohol/drug abuse. Tobacco Screening: Have you used any form of tobacco in the last 30 days? (Cigarettes, Smokeless Tobacco, Cigars, and/or Pipes): No Social History:  Social History   Substance and Sexual Activity  Alcohol Use Not Currently     Social History   Substance and Sexual Activity  Drug Use Never    Additional Social History:                           Allergies:  No Known Allergies Lab Results:  Results for orders placed or performed during the hospital encounter of 06/16/18 (from the past 48 hour(s))  Comprehensive metabolic panel     Status: Abnormal   Collection Time: 06/16/18  6:29 PM  Result Value Ref Range   Sodium 136 135 - 145 mmol/L   Potassium 3.8 3.5 - 5.1 mmol/L   Chloride 100 98 - 111 mmol/L   CO2 22 22 - 32 mmol/L   Glucose, Bld 419 (H) 70 - 99 mg/dL   BUN 10 6 - 20 mg/dL   Creatinine, Ser 0.83 0.44 - 1.00 mg/dL   Calcium 10.0 8.9 - 10.3 mg/dL   Total Protein 8.5 (H) 6.5 - 8.1 g/dL   Albumin 4.7 3.5 - 5.0 g/dL   AST 18 15 - 41 U/L   ALT 15 0 - 44 U/L   Alkaline Phosphatase 92 38 - 126 U/L   Total Bilirubin 0.7 0.3 - 1.2 mg/dL   GFR calc non Af Amer >60 >60 mL/min   GFR calc Af Amer >60 >60 mL/min   Anion gap 14 5 - 15    Comment: Performed at [facility], [place] [address], [place], [place] [address]  Ethanol     Status: None   Collection Time: 06/16/18   6:29 PM  Result Value Ref Range   Alcohol, Ethyl (B) <10 <10 mg/dL    Comment: (NOTE) Lowest detectable limit for serum alcohol is 10 mg/dL. For medical purposes only. Performed at [facility], [place] [address], [place], [place] [address]   Urine rapid drug screen (hosp performed)     Status: None   Collection Time: 06/16/18  6:29 PM  Result Value Ref Range   Opiates NONE DETECTED NONE DETECTED   Cocaine NONE DETECTED NONE DETECTED   Benzodiazepines NONE DETECTED NONE DETECTED   Amphetamines NONE DETECTED NONE DETECTED   Tetrahydrocannabinol NONE DETECTED NONE DETECTED   Barbiturates NONE DETECTED NONE DETECTED    Comment: (NOTE) DRUG SCREEN FOR MEDICAL PURPOSES ONLY.  IF CONFIRMATION IS NEEDED FOR ANY PURPOSE, NOTIFY LAB WITHIN 5  DAYS. LOWEST DETECTABLE LIMITS FOR URINE DRUG SCREEN Drug Class                     Cutoff (ng/mL) Amphetamine and metabolites    1000 Barbiturate and metabolites    200 Benzodiazepine                 829 Tricyclics and metabolites     300 Opiates and metabolites        300 Cocaine and metabolites        300 THC                            50 Performed at [place] [address], [place], [place] [address]   CBC with Diff     Status: Abnormal   Collection Time: 06/16/18  6:29 PM  Result Value Ref Range   WBC 5.4 4.0 - 10.5 K/uL   RBC 5.29 (H) 3.87 - 5.11 MIL/uL   Hemoglobin 15.6 (H) 12.0 - 15.0 g/dL   HCT 45.5 36.0 - 46.0 %   MCV 86.0 80.0 - 100.0 fL   MCH 29.5 26.0 - 34.0 pg   MCHC 34.3 30.0 - 36.0 g/dL   RDW 12.3 11.5 - 15.5 %   Platelets 268 150 - 400 K/uL   nRBC 0.0 0.0 - 0.2 %   Neutrophils Relative % 52 %   Neutro Abs 2.8 1.7 - 7.7 K/uL   Lymphocytes Relative 40 %   Lymphs Abs 2.2 0.7 - 4.0 K/uL   Monocytes Relative 5 %   Monocytes Absolute 0.3 0.1 - 1.0 K/uL   Eosinophils Relative 2 %   Eosinophils Absolute 0.1 0.0 - 0.5 K/uL   Basophils Relative 1 %   Basophils Absolute 0.0 0.0 - 0.1  K/uL   Immature Granulocytes 0 %   Abs Immature Granulocytes 0.01 0.00 - 0.07 K/uL    Comment: Performed at [facility], [place] [address], [place], [place] [address]  Salicylate level     Status: None   Collection Time: 06/16/18  6:29 PM  Result Value Ref Range   Salicylate Lvl <8.4 2.8 - 30.0 mg/dL    Comment: Performed at [facility], [place] [address], [place], [place] [address]  Acetaminophen level     Status: Abnormal   Collection Time: 06/16/18  6:29 PM  Result Value Ref Range   Acetaminophen (Tylenol), Serum <10 (L) 10 - 30 ug/mL    Comment: (NOTE) Therapeutic concentrations vary significantly. A range of 10-30 ug/mL  may be an effective concentration for many patients. However, some  are best treated at concentrations outside of this range. Acetaminophen concentrations >150 ug/mL at 4 hours after ingestion  and >50 ug/mL at 12 hours after ingestion are often associated with  toxic reactions. Performed at [facility], [place] [address] [place], [place] [address]   I-Stat beta hCG blood, ED     Status: None   Collection Time: 06/16/18  6:35 PM  Result Value Ref Range   I-stat hCG, quantitative <5.0 <5 mIU/mL   Comment 3            Comment:   GEST. AGE      CONC.  (mIU/mL)   <=1 WEEK        5 - 50     2 WEEKS       50 - 500  3 WEEKS       100 - 10,000     4 WEEKS     1,000 - 30,000        FEMALE AND NON-PREGNANT FEMALE:     LESS THAN 5 mIU/mL   CBG monitoring, ED     Status: Abnormal   Collection Time: 06/17/18 12:40 AM  Result Value Ref Range   Glucose-Capillary 341 (H) 70 - 99 mg/dL   Comment 1 Notify RN   POC CBG, ED     Status: Abnormal   Collection Time: 06/17/18  6:04 AM  Result Value Ref Range   Glucose-Capillary 307 (H) 70 - 99 mg/dL   Comment 1 Notify RN    Comment 2 Document in Chart   CBG monitoring, ED     Status: Abnormal   Collection Time: 06/17/18 12:54 PM  Result Value Ref Range    Glucose-Capillary 354 (H) 70 - 99 mg/dL    Blood Alcohol level:  Lab Results  Component Value Date   ETH <10 14/97/0263    Metabolic Disorder Labs:  Lab Results  Component Value Date   HGBA1C 7.8 (H) 12/19/2017   No results found for: PROLACTIN Lab Results  Component Value Date   CHOL 95 12/19/2017   TRIG 157.0 (H) 12/19/2017   HDL 36.60 (L) 12/19/2017   CHOLHDL 3 12/19/2017   VLDL 31.4 12/19/2017   LDLCALC 27 12/19/2017    Current Medications: Current Facility-Administered Medications  Medication Dose Route Frequency Provider Last Rate Last Dose  . acetaminophen (TYLENOL) tablet 650 mg  650 mg Oral Q6H PRN Ethelene Hal, NP      . alum & mag hydroxide-simeth (MAALOX/MYLANTA) 200-200-20 MG/5ML suspension 30 mL  30 mL Oral Q4H PRN Ethelene Hal, NP      . Derrill Memo ON 06/18/2018] FLUoxetine (PROZAC) capsule 10 mg  10 mg Oral Daily Cobos, Myer Peer, MD      . hydrOXYzine (ATARAX/VISTARIL) tablet 25 mg  25 mg Oral TID PRN Ethelene Hal, NP      . insulin aspart (novoLOG) injection 0-15 Units  0-15 Units Subcutaneous TID WC Patrecia Pour, MD      . insulin aspart (novoLOG) injection 0-5 Units  0-5 Units Subcutaneous QHS Patrecia Pour, MD      . insulin glargine (LANTUS) injection 10 Units  10 Units Subcutaneous QHS Vance Gather B, MD      . magnesium hydroxide (MILK OF MAGNESIA) suspension 30 mL  30 mL Oral Daily PRN Ethelene Hal, NP      . metFORMIN (GLUCOPHAGE) tablet 500 mg  500 mg Oral BID WC Ethelene Hal, NP      . traZODone (DESYREL) tablet 50 mg  50 mg Oral QHS PRN Ethelene Hal, NP       PTA Medications: Medications Prior to Admission  Medication Sig Dispense Refill Last Dose  . cetirizine (ZYRTEC) 10 MG tablet Take 10 mg by mouth daily.   06/16/2018 at Unknown time  . docusate sodium (COLACE) 100 MG capsule Take 1 capsule (100 mg total) by mouth 2 (two) times daily as needed for mild constipation or moderate constipation.  (Patient not taking: Reported on 04/23/2018) 30 capsule 2 Not Taking at Unknown time  . etonogestrel (NEXPLANON) 68 MG IMPL implant 1 each by Subdermal route once.     Marland Kitchen ibuprofen (ADVIL,MOTRIN) 200 MG tablet Take 400 mg by mouth daily as needed.   Past Week at Unknown time  .  sertraline (ZOLOFT) 50 MG tablet Take 1 tablet (50 mg total) by mouth daily. (Patient not taking: Reported on 04/23/2018) 30 tablet 3 Not Taking at Unknown time  . sertraline (ZOLOFT) 50 MG tablet Take 50 mg by mouth daily.    06/16/2018 at Unknown time  . Vitamin D, Ergocalciferol, (DRISDOL) 50000 units CAPS capsule Take 1 capsule (50,000 Units total) by mouth every 7 (seven) days. (Patient not taking: Reported on 04/23/2018) 30 capsule 2 Not Taking at Unknown time    Musculoskeletal: Strength & Muscle Tone: within normal limits Gait & Station: normal Patient leans: N/A  Psychiatric Specialty Exam: Physical Exam  Nursing note and vitals reviewed. Constitutional: She is oriented to person, place, and time. She appears well-developed and well-nourished.  Cardiovascular: Normal rate.  Respiratory: Effort normal.  Neurological: She is alert and oriented to person, place, and time.    Review of Systems  Constitutional: Negative.   Respiratory: Negative.   Cardiovascular: Negative.   Psychiatric/Behavioral: Positive for depression and suicidal ideas (denies suicidal plan or intent on the unit and contracts for safety). Negative for hallucinations, memory loss and substance abuse. The patient is not nervous/anxious and does not have insomnia.     Blood pressure (!) 162/97, pulse (!) 106, temperature 98.7 F (37.1 C), temperature source Oral, resp. rate 18, height _0  (1.575 m), weight 100.2 kg, currently breastfeeding.Body mass index is 40.42 kg/m.  See MD's admission SRA    Treatment Plan Summary: Daily contact with patient to assess and evaluate symptoms and progress in treatment and Medication management    Inpatient hospitalization.  See MD's admission SRA for medication management.  Patient will participate in the therapeutic group milieu.  Discharge disposition in progress.   Observation Level/Precautions:  15 minute checks  Laboratory:  HbAIC TSH, lipid panel  Psychotherapy:  Group therapy  Medications:  See MAR  Consultations:  PRN  Discharge Concerns:  Safety and stabilization  Estimated LOS: 3-5 days  Other:     Physician Treatment Plan for Primary Diagnosis: MDD (major depressive disorder), recurrent severe, without psychosis ([place]) Long Term Goal(s): Improvement in symptoms so as ready for discharge  Short Term Goals: Ability to identify changes in lifestyle to reduce recurrence of condition will improve, Ability to verbalize feelings will improve and Ability to disclose and discuss suicidal ideas  Physician Treatment Plan for Secondary Diagnosis: Principal Problem:   MDD (major depressive disorder), recurrent severe, without psychosis ([place])  Long Term Goal(s): Improvement in symptoms so as ready for discharge  Short Term Goals: Ability to demonstrate self-control will improve, Ability to identify and develop effective coping behaviors will improve and Ability to identify triggers associated with substance abuse/mental health issues will improve  I certify that inpatient services furnished can reasonably be expected to improve the patient's condition.    Connye Burkitt, NP 3/18/20203:49 PM   I have discussed case with NP and have met with patient  Agree with NP note and assessment  23, single, lives with BF and son,  has a 63 old child, who is currently with patient's mother. 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 Patient presented to ED voluntarily on 3/17 due to worsening depression. States she has been feeling more depressed recently, and cannot identify any specific triggers. Reports that over recent days she has had some intermittent suicidal  ideations, with thoughts of crashing her car. She denies any psychotic symptoms. She states she has been feeling more anxious recently, mainly  about the coronavirus epidemic. She has a child in December 2019 and is 3 months postpartum. Reports some neuro-vegetative symptoms- identifies sadness, some decreased energy, but describes normal appetite and denies anhedonia.  Denies history of prior psychiatric admissions, no prior history of suicide attempts, history of self cutting, last time several years ago. She does have a history of prior depressive episodes . Denies history of mania, describes prior history of panic attacks which have improved overtime. Denies history of psychosis. Does not endorse history of PTSD , denies history of violence.  Denies alcohol or drug abuse. Medical History - DM II. NKDA. Does not smoke.  S/P C Section 03/2018. She states she is not currently breast feeding. Patient was not taking any medications prior to admission .  Had been on Metformin in the past, but off it x several weeks. Reports she had been prescribed Zoloft prior to pregnancy, but took it only briefly before she found out she was pregnant and decided to discontinue it .  Parents are alive, live together, has three siblings . One sister has Social Phobia. No suicides in family.   Dx- MDD / Postpartum Depression- no psychotic features   Plan- Start Prozac 10 mgrs QAM  I reviewed case with hospitalist consultant regarding diabetic management, who will add insulin coverage

## 2018-06-17 NOTE — Progress Notes (Signed)
Report given to RN at [facility]. [facility] notified for transport between 1:30 - 2 pm.

## 2018-06-17 NOTE — ED Notes (Signed)
CBG this am without having any food or calorie beverage since last CBG was

## 2018-06-17 NOTE — [facility] Group Notes (Signed)
[facility] Group Notes:  (Nursing/MHT/Case Management/Adjunct)  Date:  06/17/2018  Time:  4:00 PM  Type of Therapy:  Nurse Education  Participation Level:  Active  Participation Quality:  Appropriate and Attentive  Affect:  Appropriate  Cognitive:  Alert and Appropriate  Insight:  Appropriate  Engagement in Group:  Engaged  Modes of Intervention:  Discussion and Education  Summary of Progress/Problems: pt' were allowed to share positive and negative coping mechanisms and how to access resources in the community.  Suszanne Conners Antero Derosia 06/17/2018, 5:57 PM

## 2018-06-17 NOTE — ED Notes (Signed)
Has IV in right wrist from earlier in the shift getting fluids in the ED.

## 2018-06-17 NOTE — ED Notes (Signed)
CBG this am 307 after not eating or drinking anything. Diet changed for this am as ordered by Dr to carb restricted. Dr notified of CBG. She will determine if there is a need for her to have further orders or consults re her unmanaged diabetes.

## 2018-06-17 NOTE — ED Notes (Signed)
Gave report to acute unit RN and she drew attention to her CBG being 419. CBG done at this time and it was 341. Called Dr Nicanor Alcon with concern. States she has a long hx of non compliance with her sugar and it would present a greater risk to lower it at this time. She ordered a carb modified diet and to repeat CBG at 0600 this am.

## 2018-06-17 NOTE — BH Assessment (Signed)
[facility] Assessment Progress Note  Per Juanetta Beets, DO, this pt requires psychiatric hospitalization at this time.  Malva Limes, RN, [facility] has assigned pt to [facility] Rm [id number]; [facility] will be ready to receive pt between 13:30 and 14:00.  Pt has signed Voluntary Admission and Consent for Treatment, as well as Consent to Release Information to the [facility], and a notification call has been placed.  Signed forms have been faxed to [facility].  Pt's nurse, Raven, has been notified, and agrees to send original paperwork along with pt via [facility], and to call report to [phone].  Doylene Canning, [place] [facility] Coordinator [phone]

## 2018-06-17 NOTE — Progress Notes (Signed)
Inpatient Diabetes Program Recommendations  AACE/ADA: New Consensus Statement on Inpatient Glycemic Control (2015)  Target Ranges:  Prepandial:   less than 140 mg/dL      Peak postprandial:   less than 180 mg/dL (1-2 hours)      Critically ill patients:  140 - 180 mg/dL    Review of Glycemic Control  Diabetes history: Gestational DM, diagnosed with preexisting Diabetes at time of pregnancy based on her glucose levels and A1c level Outpatient Diabetes medications: Metformin 1000 mg BID Current orders for Inpatient glycemic control: Metformin 1000 mg BID  Inpatient Diabetes Program Recommendations:    Patient has seen Dr. Lucianne Muss, Endocrinologist in the past during pregnancy for DM management. Prior to pregnancy her A1c was > 13%. Dr. Lucianne Muss placed patient on long and short acting insulin during pregnancy. Based on glucose trends, patient may need insulin at time of d/c. Will see patient.  Patient reports she is from [place] and will find a PCP to go to within 2 weeks for DM follow up. Patient has [employer] through her father. Called [employer] to inquire about insulin coverage for patient. Basaglar and Levemir are preferred.   Consider Basaglar KwikPen 15 units Daily on discharge, Insulin pen needles (order # [id number])  Thanks,  Christena Deem RN, MSN, BC-ADM Inpatient Diabetes Coordinator Team Pager [phone] (8a-5p)

## 2018-06-17 NOTE — Tx Team (Signed)
Initial Treatment Plan 06/17/2018 2:19 PM Angela Barry UMP:[id number]    PATIENT STRESSORS: Educational concerns Other: birth of child   PATIENT STRENGTHS: Ability for insight Average or above average intelligence Capable of independent living General fund of knowledge Motivation for treatment/growth Supportive family/friends   PATIENT IDENTIFIED PROBLEMS: Depression Suicidal thoughts "I need to find ways to cope"                     DISCHARGE CRITERIA:  Ability to meet basic life and health needs Improved stabilization in mood, thinking, and/or behavior Reduction of life-threatening or endangering symptoms to within safe limits Verbal commitment to aftercare and medication compliance  PRELIMINARY DISCHARGE PLAN: Attend aftercare/continuing care group Return to previous living arrangement  PATIENT/FAMILY INVOLVEMENT: This treatment plan has been presented to and reviewed with the patient, Angela Barry, and/or family member, .  The patient and family have been given the opportunity to ask questions and make suggestions.  Angela Barry, [place], [place] 06/17/2018, 2:19 PM

## 2018-06-17 NOTE — ED Notes (Signed)
c 

## 2018-06-17 NOTE — [facility] Suicide Risk Assessment (Addendum)
[facility] Admission Suicide Risk Assessment   Nursing information obtained from:  Patient Demographic factors:  Adolescent or young adult Current Mental Status:  NA Loss Factors:  NA Historical Factors:  Prior suicide attempts Risk Reduction Factors:  Responsible for children under 23 years of age, Living with another person, especially a relative, Positive therapeutic relationship, Positive coping skills or problem solving skills  Total Time spent with patient: 45 minutes Principal Problem:  Diagnosis:  Active Problems:   MDD (major depressive disorder), single episode with postpartum onset  Subjective Data:   Continued Clinical Symptoms:  Alcohol Use Disorder Identification Test Final Score (AUDIT): 0 The "Alcohol Use Disorders Identification Test", Guidelines for Use in Primary Care, Second Edition.  World Science writer [facility]). Score between 0-7:  no or low risk or alcohol related problems. Score between 8-15:  moderate risk of alcohol related problems. Score between 16-19:  high risk of alcohol related problems. Score 20 or above:  warrants further diagnostic evaluation for alcohol dependence and treatment.   CLINICAL FACTORS:  23, single, lives with BF and son,  has a three month old child, who is currently with patient's mother. She is a Higher education careers adviser .  Currently not employed. Patient presented to ED voluntarily on 3/17 due to worsening depression. States she has been feeling more depressed recently, and cannot identify any specific triggers. Reports that over recent days she has had some intermittent suicidal ideations, with thoughts of crashing her car. She denies any psychotic symptoms. She states she has been feeling more anxious recently, mainly about the coronavirus epidemic. She has a child in December 2019 and is 3 months postpartum. Reports some neuro-vegetative symptoms- identifies sadness, some decreased energy, but describes normal appetite and denies anhedonia.   Denies history of prior psychiatric admissions, no prior history of suicide attempts, history of self cutting, last time several years ago. She does have a history of prior depressive episodes . Denies history of mania, describes prior history of panic attacks which have improved overtime. Denies history of psychosis. Does not endorse history of PTSD , denies history of violence.  Denies alcohol or drug abuse. Medical History - DM II. NKDA. Does not smoke.  S/P C Section 03/2018. She states she is not currently breast feeding. Patient was not taking any medications prior to admission .  Had been on Metformin in the past, but off it x several weeks. Reports she had been prescribed Zoloft prior to pregnancy, but took it only briefly before she found out she was pregnant and decided to discontinue it .  Parents are alive, live together, has three siblings . One sister has Social Phobia. No suicides in family.   Dx- MDD / Postpartum Depression- no psychotic features   Plan- Start Prozac 10 mgrs QAM  I reviewed case with hospitalist consultant regarding diabetic management, who will add insulin coverage  Musculoskeletal: Strength & Muscle Tone: within normal limits Gait & Station: normal Patient leans: N/A  Psychiatric Specialty Exam: Physical Exam  ROS no headache, no chest pain, no shortness of breath , no coughing , no rash   Blood pressure (!) 162/97, pulse (!) 106, temperature 99.6 F (37.6 C), temperature source Oral, resp. rate 18, height 5\' 2"  (1.575 m), weight 100.2 kg, currently breastfeeding.Body mass index is 40.42 kg/m.  Repeat temp 98.7   General Appearance: Well Groomed  Eye Contact:  Good  Speech:  Normal Rate  Volume:  Normal  Mood:  depressed, states her mood is currently 5/10  Affect:  depressed but reactive, smiles at times appropriately  Thought Process:  Linear and Descriptions of Associations: Intact  Orientation:  Full (Time, Place, and Person)  Thought Content:   no hallucinations, no delusions   Suicidal Thoughts:  No denies current suicidal or self injurious ideations, denies homicidal or violent ideations   Homicidal Thoughts:  No  Memory:  recent and remote grossly intact   Judgement:  Fair  Insight:  Fair  Psychomotor Activity:  Normal  Concentration:  Concentration: Good and Attention Span: Good  Recall:  Good  Fund of Knowledge:  Good  Language:  Good  Akathisia:  Negative  Handed:  Right  AIMS (if indicated):     Assets:  Communication Skills Desire for Improvement Resilience  ADL's:  Intact  Cognition:  WNL  Sleep:         COGNITIVE FEATURES THAT CONTRIBUTE TO RISK:  Depression - mild to moderate neuro-vegetative symptoms  SUICIDE RISK:   Moderate:  Frequent suicidal ideation with limited intensity, and duration, some specificity in terms of plans, no associated intent, good self-control, limited dysphoria/symptomatology, some risk factors present, and identifiable protective factors, including available and accessible social support.  PLAN OF CARE: Patient will be admitted to inpatient psychiatric unit for stabilization and safety. Will provide and encourage milieu participation. Provide medication management and maked adjustments as needed.  Will follow daily.    I certify that inpatient services furnished can reasonably be expected to improve the patient's condition.   Craige Cotta, MD 06/17/2018, 3:07 PM

## 2018-06-17 NOTE — Progress Notes (Signed)
Angela Barry is a 23 year old female pt admitted on voluntary basis. On admission, she reports that she drove herself to the hospital and reports she did so because she has been feeling depressed and suicidal. She reports that she is feeling better today than yesterday and denies SI currently and is able to contract for safety while in the hospital. She reports that she has never been hospitalized before but has had depression for a few years now. She reports that she was on a trial of zoloft but then got pregnant. She denies any substance abuse issues. She does report that she has an endocrinologist that she sees for her diabetes. She reports that she lives with her boyfriend and son and will return there once she is discharged. She was cooperative during admission, oriented to the milieu and safety maintained.

## 2018-06-18 DIAGNOSIS — F332 Major depressive disorder, recurrent severe without psychotic features: Secondary | ICD-10-CM

## 2018-06-18 LAB — GLUCOSE, CAPILLARY
GLUCOSE-CAPILLARY: 372 mg/dL — AB (ref 70–99)
Glucose-Capillary: 247 mg/dL — ABNORMAL HIGH (ref 70–99)
Glucose-Capillary: 353 mg/dL — ABNORMAL HIGH (ref 70–99)
Glucose-Capillary: 322 mg/dL — ABNORMAL HIGH (ref 70–99)

## 2018-06-18 LAB — LIPID PANEL
Cholesterol: 144 mg/dL (ref 0–200)
HDL: 27 mg/dL — ABNORMAL LOW (ref >40)
LDL Cholesterol: 41 mg/dL (ref 0–99)
TRIGLYCERIDES: 378 mg/dL — AB (ref <150)
Total CHOL/HDL Ratio: 5.3 RATIO
VLDL: 76 mg/dL — ABNORMAL HIGH (ref 0–40)

## 2018-06-18 LAB — HEMOGLOBIN A1C
Hgb A1c MFr Bld: 13.1 % — ABNORMAL HIGH (ref 4.8–5.6)
Mean Plasma Glucose: 329.27 mg/dL

## 2018-06-18 LAB — TSH: TSH: 1.659 u[IU]/mL (ref 0.350–4.500)

## 2018-06-18 MED ORDER — INSULIN ASPART 100 UNIT/ML ~~LOC~~ SOLN
5.0000 [IU] | Freq: Three times a day (TID) | SUBCUTANEOUS | Status: DC
  Administered 2018-06-18 – 2018-06-19 (×2): 5 [IU] via SUBCUTANEOUS

## 2018-06-18 MED ORDER — INSULIN GLARGINE 100 UNIT/ML ~~LOC~~ SOLN
20.0000 [IU] | Freq: Every day | SUBCUTANEOUS | Status: DC
  Administered 2018-06-18: 20 [IU] via SUBCUTANEOUS

## 2018-06-18 NOTE — Progress Notes (Addendum)
[facility] MD Progress Note  06/18/2018 2:11 PM Angela Barry  MRN:  [id number] Subjective:  I'm good."  Ms. Pincock found sitting in the dayroom. She reports improving mood over the past two days. Presents with fuller range of affect, smiles at times appropriately. She states coloring and reading have helped her mood. Denies medication side effects. Denies SI. She has been participating in group therapy. Patient had not been taking any medications for diabetes prior to admission. Metformin and insulin were started yesterday, but blood sugars remain high. CBG 247 this AM, 372 before lunch. a1C drawn this morning- 13.1.   From admission H&P: Patient presented to ED voluntarily on 3/17 due to worsening depression. States she has been feeling more depressed recently, and cannot identify any specific triggers. Reports that over recent days she has had some intermittent suicidal ideations, with thoughts of crashing her car. She denies any psychotic symptoms. She states she has been feeling more anxious recently, mainly about the coronavirus epidemic. She has a child in December 2019 and is 3 months postpartum.  Principal Problem: MDD (major depressive disorder), recurrent severe, without psychosis (HCC) Diagnosis: Principal Problem:   MDD (major depressive disorder), recurrent severe, without psychosis (HCC)  Total Time spent with patient: 15 minutes  Past Psychiatric History: See admission H&P  Past Medical History:  Past Medical History:  Diagnosis Date  . Diabetes mellitus without complication [facility])     Past Surgical History:  Procedure Laterality Date  . CESAREAN SECTION N/A 03/09/2018   Procedure: CESAREAN SECTION;  Surgeon: Adam Phenix, MD;  Location: [facility] BIRTHING SUITES;  Service: Obstetrics;  Laterality: N/A;  . NO PAST SURGERIES     Family History:  Family History  Problem Relation Age of Onset  . Diabetes Father   . Diabetes Paternal Grandmother   . Diabetes Paternal Grandfather    . Hypertension Neg Hx    Family Psychiatric  History: See admission H&P Social History:  Social History   Substance and Sexual Activity  Alcohol Use Not Currently     Social History   Substance and Sexual Activity  Drug Use Never    Social History   Socioeconomic History  . Marital status: Single    Spouse name: Not on file  . Number of children: Not on file  . Years of education: Not on file  . Highest education level: Not on file  Occupational History  . Not on file  Social Needs  . Financial resource strain: Not on file  . Food insecurity:    Worry: Not on file    Inability: Not on file  . Transportation needs:    Medical: Not on file    Non-medical: Not on file  Tobacco Use  . Smoking status: Never Smoker  . Smokeless tobacco: Never Used  Substance and Sexual Activity  . Alcohol use: Not Currently  . Drug use: Never  . Sexual activity: Yes    Partners: Male  Lifestyle  . Physical activity:    Days per week: Not on file    Minutes per session: Not on file  . Stress: Not on file  Relationships  . Social connections:    Talks on phone: Not on file    Gets together: Not on file    Attends religious service: Not on file    Active member of club or organization: Not on file    Attends meetings of clubs or organizations: Not on file    Relationship status: Not on file  Other Topics Concern  . Not on file  Social History Narrative  . Not on file   Additional Social History:                         Sleep: Good  Appetite:  Good  Current Medications: Current Facility-Administered Medications  Medication Dose Route Frequency Provider Last Rate Last Dose  . acetaminophen (TYLENOL) tablet 650 mg  650 mg Oral Q6H PRN Laveda AbbeParks, Laurie Britton, NP      . alum & mag hydroxide-simeth (MAALOX/MYLANTA) 200-200-20 MG/5ML suspension 30 mL  30 mL Oral Q4H PRN Laveda AbbeParks, Laurie Britton, NP      . FLUoxetine (PROZAC) capsule 10 mg  10 mg Oral Daily Cobos, Rockey SituFernando  A, MD   10 mg at 06/18/18 [id number]  . hydrOXYzine (ATARAX/VISTARIL) tablet 25 mg  25 mg Oral TID PRN Laveda AbbeParks, Laurie Britton, NP   25 mg at 06/17/18 2126  . insulin aspart (novoLOG) injection 0-15 Units  0-15 Units Subcutaneous TID WC Tyrone NineGrunz, Ryan B, MD   15 Units at 06/18/18 1216  . insulin aspart (novoLOG) injection 0-5 Units  0-5 Units Subcutaneous QHS Tyrone NineGrunz, Ryan B, MD   3 Units at 06/17/18 2123  . insulin aspart (novoLOG) injection 5 Units  5 Units Subcutaneous TID WC Aldean BakerSykes, Janet E, NP      . insulin glargine (LANTUS) injection 20 Units  20 Units Subcutaneous QHS Marciano SequinSykes, Janet E, NP      . magnesium hydroxide (MILK OF MAGNESIA) suspension 30 mL  30 mL Oral Daily PRN Laveda AbbeParks, Laurie Britton, NP      . metFORMIN (GLUCOPHAGE) tablet 500 mg  500 mg Oral BID WC Laveda AbbeParks, Laurie Britton, NP   500 mg at 06/18/18 0824  . traZODone (DESYREL) tablet 50 mg  50 mg Oral QHS PRN Laveda AbbeParks, Laurie Britton, NP   50 mg at 06/17/18 2126    Lab Results:  Results for orders placed or performed during the hospital encounter of 06/17/18 (from the past 48 hour(s))  Glucose, capillary     Status: Abnormal   Collection Time: 06/17/18  5:30 PM  Result Value Ref Range   Glucose-Capillary 355 (H) 70 - 99 mg/dL   Comment 1 Notify RN    Comment 2 Document in Chart   Glucose, capillary     Status: Abnormal   Collection Time: 06/17/18  8:34 PM  Result Value Ref Range   Glucose-Capillary 251 (H) 70 - 99 mg/dL  Glucose, capillary     Status: Abnormal   Collection Time: 06/18/18  6:26 AM  Result Value Ref Range   Glucose-Capillary 247 (H) 70 - 99 mg/dL   Comment 1 QC Due   Hemoglobin A1c     Status: Abnormal   Collection Time: 06/18/18  6:52 AM  Result Value Ref Range   Hgb A1c MFr Bld 13.1 (H) 4.8 - 5.6 %    Comment: (NOTE) Pre diabetes:          5.7%-6.4% Diabetes:              >6.4% Glycemic control for   <7.0% adults with diabetes    Mean Plasma Glucose 329.27 mg/dL    Comment: Performed at [facility],  [address] [place], [place] [address]  Lipid panel     Status: Abnormal   Collection Time: 06/18/18  6:52 AM  Result Value Ref Range   Cholesterol 144 0 - 200 mg/dL   Triglycerides 696378 (  H) <150 mg/dL   HDL 27 (L) >96 mg/dL   Total CHOL/HDL Ratio 5.3 RATIO   VLDL 76 (H) 0 - 40 mg/dL   LDL Cholesterol 41 0 - 99 mg/dL    Comment:        Total Cholesterol/HDL:CHD Risk Coronary Heart Disease Risk Table                     Men   Women  1/2 Average Risk   3.4   3.3  Average Risk       5.0   4.4  2 X Average Risk   9.6   7.1  3 X Average Risk  23.4   11.0        Use the calculated Patient Ratio above and the CHD Risk Table to determine the patient's CHD Risk.        ATP III CLASSIFICATION (LDL):  <100     mg/dL   Optimal  283-662  mg/dL   Near or Above                    Optimal  130-159  mg/dL   Borderline  947-654  mg/dL   High  >650     mg/dL   Very High Performed at [facility], [address], [place], [place] [address]   TSH     Status: None   Collection Time: 06/18/18  6:52 AM  Result Value Ref Range   TSH 1.659 0.350 - 4.500 uIU/mL    Comment: Performed by a 3rd Generation assay with a functional sensitivity of <=0.01 uIU/mL. Performed at [facility], [address] [place], [place] [address]   Glucose, capillary     Status: Abnormal   Collection Time: 06/18/18 11:44 AM  Result Value Ref Range   Glucose-Capillary 372 (H) 70 - 99 mg/dL    Blood Alcohol level:  Lab Results  Component Value Date   ETH <10 06/16/2018    Metabolic Disorder Labs: Lab Results  Component Value Date   HGBA1C 13.1 (H) 06/18/2018   MPG 329.27 06/18/2018   No results found for: PROLACTIN Lab Results  Component Value Date   CHOL 144 06/18/2018   TRIG 378 (H) 06/18/2018   HDL 27 (L) 06/18/2018   CHOLHDL 5.3 06/18/2018   VLDL 76 (H) 06/18/2018   LDLCALC 41 06/18/2018   LDLCALC 27 12/19/2017    Physical Findings: AIMS: Facial and Oral  Movements Muscles of Facial Expression: None, normal Lips and Perioral Area: None, normal Jaw: None, normal Tongue: None, normal,Extremity Movements Upper (arms, wrists, hands, fingers): None, normal Lower (legs, knees, ankles, toes): None, normal, Trunk Movements Neck, shoulders, hips: None, normal, Overall Severity Severity of abnormal movements (highest score from questions above): None, normal Incapacitation due to abnormal movements: None, normal Patient's awareness of abnormal movements (rate only patient's report): No Awareness, Dental Status Current problems with teeth and/or dentures?: No Does patient usually wear dentures?: No  CIWA:    COWS:     Musculoskeletal: Strength & Muscle Tone: within normal limits Gait & Station: normal Patient leans: N/A  Psychiatric Specialty Exam: Physical Exam  Nursing note and vitals reviewed. Constitutional: She is oriented to person, place, and time. She appears well-developed and well-nourished.  Cardiovascular: Normal rate.  Respiratory: Effort normal.  Musculoskeletal: Normal range of motion.  Neurological: She is alert and oriented to person, place, and time.  Psychiatric: Her behavior is normal.  Review of Systems  Constitutional: Negative.   Respiratory: Negative.   Cardiovascular: Negative.   Genitourinary: Negative.   Psychiatric/Behavioral: Positive for depression (improving). Negative for hallucinations, memory loss, substance abuse and suicidal ideas. The patient is not nervous/anxious and does not have insomnia.     Blood pressure 142/83, heart rate 84, temperature 98.1 oral, respirations 18, SpO2 99. The patient is not breastfeeding.  General Appearance: Casual  Eye Contact:  Good  Speech:  Normal Rate  Volume:  Normal  Mood:  Euthymic  Affect:  Appropriate  Thought Process:  Coherent  Orientation:  Full (Time, Place, and Person)  Thought Content:  WDL  Suicidal Thoughts:  No  Homicidal Thoughts:  No  Memory:   Immediate;   Good Recent;   Good  Judgement:  Intact  Insight:  Fair  Psychomotor Activity:  Normal  Concentration:  Concentration: Good  Recall:  Good  Fund of Knowledge:  Fair  Language:  Good  Akathisia:  No  Handed:  Right  AIMS (if indicated):     Assets:  Communication Skills Desire for Improvement Housing Resilience Social Support  ADL's:  Intact  Cognition:  WNL  Sleep:  Number of Hours: 6.75     Treatment Plan Summary: Daily contact with patient to assess and evaluate symptoms and progress in treatment and Medication management   Continue inpatient hospitalization.  Insulin adjustments per diabetes coordinator and hospitalist: Lantus increased to 20 units SQ QHS for diabetes Start Novolog 5 units SQ TID with meals if patient eats 50% of meal Continue sliding scale insulin coverage ACHS for diabetes  Continue metformin 500 mg PO BID with meal for diabetes Continue Prozac 10 mg PO daily for mood Continue Vistaril 25 mg PO TID PRN anxiety Continue trazodone 50 mg PO QHS PRN insomnia  Patient will participate in the therapeutic group milieu.  Discharge disposition in progress.   Aldean Baker, NP 06/18/2018, 2:11 PM  Agree with NP progress note

## 2018-06-18 NOTE — Progress Notes (Signed)
D:  Angela Barry was in the day room much of the evening.  She was noted interacting well with staff and peers.  She denied SI/HI or A/V hallucinations.  She denied any pain or discomfort and appeared to be in no physical distress.  She requested sleep and anxiety medications at bedtime.  She is currently resting with her eyes closed and appears to be asleep.   A:  1:1 with RN for support and encouragement.  Medications as ordered.  Q 15 minute checks maintained for safety.  Encouraged participation in group and unit activities.   R:  Angela Barry remains safe on the unit.  We will continue to monitor the progress towards her goals.

## 2018-06-18 NOTE — Progress Notes (Signed)
D: Pt was in dayroom upon initial approach.  She presents with anxious affect and mood.  She smiles with interaction.  Pt describes her day as "pretty good."  Her goal is to "get out of here, go home."  Pt reports she is discharging tomorrow or Saturday and she feels safe to do so.  She reports the best part of her day was "going outside."  Denies SI/HI, hallucinations, and pain.  She has been visible in milieu interacting with peers and staff appropriately.  Attended evening group.  A: Introduced self to pt.  Met with pt 1:1.  Actively listened to pt and provided support and encouragement.   Medication administered per order.  PRN medication administered for anxiety and sleep.  Q15 minute safety checks maintained.  R: Pt is compliant with medications.  She verbally contracts for safety and reports she will inform staff of needs and concerns.  Will continue to monitor and assess.

## 2018-06-18 NOTE — [facility] Suicide Risk Assessment (Signed)
[facility] INPATIENT:  Family/Significant Other Suicide Prevention Education  Suicide Prevention Education:  Patient Refusal for Family/Significant Other Suicide Prevention Education: The patient Angela Barry has refused to provide written consent for family/significant other to be provided Family/Significant Other Suicide Prevention Education during admission and/or prior to discharge.  Physician notified.  SPE completed with patient, as patient refused to consent to family contact. SPI pamphlet provided to pt and pt was encouraged to share information with support network, ask questions, and talk about any concerns relating to SPE. Patient denies access to guns/firearms and verbalized understanding of information provided. Mobile Crisis information also provided to patient.    Maeola Sarah 06/18/2018, 10:13 AM

## 2018-06-18 NOTE — Plan of Care (Addendum)
  Problem: Activity: Goal: Interest or engagement in activities will improve Outcome: Progressing   D: Pt alert and oriented on the unit. Pt engaging with RN staff and other pts. Pt denies SI/HI, A/VH. Pt's affect was flat today but brightened during conversation with RN. Pt participated during unit groups and activities. Pt is pleasant and cooperative. Pt rated her depression a 1, and both anxiety and feelings of hopelessness a 0, with 10 being the worst. Pt's goal for today is "find more coping skills."  A: Education, support and encouragement provided, q15 minute safety checks remain in effect. Medications administered per MD orders.  R: No reactions/side effects to medicine noted. Pt denies any concerns at this time, and verbally contracts for safety. Pt ambulating on the unit with no issues. Pt remains safe on and off the unit.

## 2018-06-18 NOTE — Progress Notes (Signed)
Inpatient Diabetes Program Recommendations  AACE/ADA: New Consensus Statement on Inpatient Glycemic Control (2015)  Target Ranges:  Prepandial:   less than 140 mg/dL      Peak postprandial:   less than 180 mg/dL (1-2 hours)      Critically ill patients:  140 - 180 mg/dL   Lab Results  Component Value Date   GLUCAP 247 (H) 06/18/2018   HGBA1C 13.1 (H) 06/18/2018    Review of Glycemic Control  Diabetes history: Gestational DM, diagnosed with preexisting Diabetes at time of pregnancy based on her glucose levels and A1c level Outpatient Diabetes medications: Metformin 1000 mg BID Current orders for Inpatient glycemic control: Lantus 10 units q hs + Novolog moderate correction tid + hs 0-5 units + Metformin 1000 mg BID  Inpatient Diabetes Program Recommendations:   -Increase Lantus to 20 units daily (0.2 units/kg x 100.2 kg) -Add Novolog 5 units tid meal coverage if eats 50%  Thank you, Darel Hong E. Davin Archuletta, RN, MSN, CDE  Diabetes Coordinator Inpatient Glycemic Control Team Team Pager [phone] (8am-5pm) 06/18/2018 10:04 AM

## 2018-06-18 NOTE — BHH Counselor (Signed)
Adult Comprehensive Assessment  Patient ID: Angela Barry, female   DOB: May 23, 1995, 23 y.o.   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 [employer]; Patient reports she worries about gradution  Employment / Job issues: Physicist, medical; Denies any current stressors  Family Relationships: Patient denies any current Engineering geologist / Lack of resources (include bankruptcy): Patient denies any current stressors  Housing / Lack of housing: Lives in an off-campus student apartment; Lives with boyfriend and 94 month old son; Patient denies any current stressors  Physical health (include injuries & life threatening diseases): Patient denies any current stressors  Social relationships: Patient denies any current stressors  Substance abuse: Patient denies any current stressors  Bereavement / Loss: Patient denies any current stressors   Living/Environment/Situation:  Living Arrangements: Spouse/significant other, Children Living conditions (as described by patient or guardian): "Good"  Who else lives in the home?: Boyfriend and 32 month old son How long has patient lived in current situation?: Since November 2019  What is atmosphere in current home: Comfortable, Supportive, Loving  Family History:  Marital status: Long term relationship Long term relationship, how long?: 10 years What types of issues is patient dealing with in the relationship?: Patient denies any issues  Additional relationship information: No  Are you sexually active?: Yes What is your sexual orientation?: Heterosexual  Has your sexual activity been affected by drugs, alcohol,  medication, or emotional stress?: No  Does patient have children?: Yes How many children?: 1 How is patient's relationship with their children?: Patient reports she has a loving and close relationship with her 3 month son  Childhood History:  By whom was/is the patient raised?: Both parents Description of patient's relationship with caregiver when they were a child: Patient reports having a good relationship with her parents during her childhood.  Patient's description of current relationship with people who raised him/her: Patient reports having a close relationship with her mother currently. She reports that she and her father continue to communicate, however they are not as close as they use to be.  How were you disciplined when you got in trouble as a child/adolescent?: Whoopings Does patient have siblings?: Yes Number of Siblings: 3 Description of patient's current relationship with siblings: Patient reports having a good relationship with her three siblings.  Did patient suffer any verbal/emotional/physical/sexual abuse as a child?: No Did patient suffer from severe childhood neglect?: No Has patient ever been sexually abused/assaulted/raped as an adolescent or adult?: No Was the patient ever a victim of a crime or a disaster?: No Witnessed domestic violence?: No Has patient been effected by domestic violence as an adult?: No  Education:  Highest grade of school patient has completed: Some college Currently a student?: Yes Name of school: Holiday representative at [employer]  How long has the patient attended?: 4 years  Learning disability?: No  Employment/Work Situation:   Employment situation: Surveyor, minerals job has been impacted by current illness: No What is the longest time patient has a held a job?: 2 1/2 years  Where was the patient employed at that time?: [employer]  Did You Receive Any Psychiatric Treatment/Services While in [employer]?: No Are There Guns  or  Other Weapons in Your Home?: No  Financial Resources:   Financial resources: Income from spouse, Private insurance(Financial aid for school. ) Does patient have a representative payee or guardian?: No  Alcohol/Substance Abuse:   What has been your use of drugs/alcohol within the last 12 months?: Patient denies  If attempted suicide, did drugs/alcohol play a role in this?: No Alcohol/Substance Abuse Treatment Hx: Denies past history Has alcohol/substance abuse ever caused legal problems?: No  Social Support System:   Patient's Community Support System: Good Describe Community Support System: "My boyfriend, my mother and my two best friends"  Type of faith/religion: Christianity  How does patient's faith help to cope with current illness?: Prayer   Leisure/Recreation:   Leisure and Hobbies: "Reading, soccer and swimming"   Strengths/Needs:   What is the patient's perception of their strengths?: "Multi-tasking, Organizing"  Patient states they can use these personal strengths during their treatment to contribute to their recovery: Yes  Patient states these barriers may affect/interfere with their treatment: No  Patient states these barriers may affect their return to the community: No  Other important information patient would like considered in planning for their treatment: No   Discharge Plan:   Currently receiving community mental health services: No Patient states concerns and preferences for aftercare planning are: Patient expressed interest in possible outpatient follow up at discharge. CSW will continue to follow.  Patient states they will know when they are safe and ready for discharge when: Yes, once coping skills have been learned  Does patient have access to transportation?: Yes Does patient have financial barriers related to discharge medications?: No Will patient be returning to same living situation after discharge?: Yes  Summary/Recommendations:   Summary and  Recommendations (to be completed by the evaluator): Angela Barry is a 23 year old female who is diagnosed with Postpartum depression. She presented to the hospital seeking treatment forsymptoms of depression and suicidal ideation. During the assessment, Savera was pleasant and cooperative with providing information. Lannie reports that she has experienced depression for years and that she stopped taking her antidepressant medications when she learned she was pregnant. Sandy reports that she noticed another increase of depressive symptoms following the birth of her three-month old son. Sacred reports that would like to be stabilized on an anti-depressant medication and learn coping mechanisms to help manage her symptoms, while in the hospital. Savina expressed interest in outpatient referrals for continuity of care. Kateline can benefit from crisis stabilization, medication management, therapeutic milieu and referral services.   Maeola Sarah. 06/18/2018

## 2018-06-19 LAB — GLUCOSE, CAPILLARY: Glucose-Capillary: 252 mg/dL — ABNORMAL HIGH (ref 70–99)

## 2018-06-19 MED ORDER — METFORMIN HCL 500 MG PO TABS
1000.0000 mg | ORAL_TABLET | Freq: Two times a day (BID) | ORAL | Status: DC
  Filled 2018-06-19 (×2): qty 2

## 2018-06-19 MED ORDER — HYDROXYZINE HCL 25 MG PO TABS: 25.0000 mg | ORAL_TABLET | Freq: Three times a day (TID) | ORAL | 0 refills | Status: DC | PRN

## 2018-06-19 MED ORDER — INSULIN GLARGINE 100 UNIT/ML ~~LOC~~ SOLN: 25.0000 [IU] | Freq: Every day | SUBCUTANEOUS | Status: DC

## 2018-06-19 MED ORDER — METFORMIN HCL 1000 MG PO TABS: 1000.0000 mg | ORAL_TABLET | Freq: Two times a day (BID) | ORAL | 0 refills | Status: DC

## 2018-06-19 MED ORDER — TRAZODONE HCL 50 MG PO TABS: 50.0000 mg | ORAL_TABLET | Freq: Every evening | ORAL | 0 refills | Status: DC | PRN

## 2018-06-19 MED ORDER — FLUOXETINE HCL 10 MG PO CAPS: 10.0000 mg | ORAL_CAPSULE | Freq: Every day | ORAL | 0 refills | Status: DC

## 2018-06-19 MED ORDER — INSULIN GLARGINE 100 UNIT/ML ~~LOC~~ SOLN: 25.0000 [IU] | Freq: Every day | SUBCUTANEOUS | 0 refills | Status: DC

## 2018-06-19 NOTE — BHH Suicide Risk Assessment (Signed)
[facility] Discharge Suicide Risk Assessment   Principal Problem: MDD (major depressive disorder), recurrent severe, without psychosis (HCC) Discharge Diagnoses: Principal Problem:   MDD (major depressive disorder), recurrent severe, without psychosis (HCC)   Total Time spent with patient: 30 minutes  Musculoskeletal: Strength & Muscle Tone: within normal limits Gait & Station: normal Patient leans: N/A  Psychiatric Specialty Exam: ROS denies chest pain , no shortness of breath, no vomiting, no fever, no chills  Blood pressure 128/79, pulse (!) 113, temperature 98.3 F (36.8 C), temperature source Oral, resp. rate 20, height 5\' 2"  (1.575 m), weight 100.2 kg, currently breastfeeding.Body mass index is 40.42 kg/m.  General Appearance: Well Groomed  Eye Contact::  Good  Speech:  Normal Rate409  Volume:  Normal  Mood:  Reports improved mood.  Feels " a lot better"  Affect:  Appropriate and More reactive  Thought Process:  Linear and Descriptions of Associations: Intact  Orientation:  Full (Time, Place, and Person)  Thought Content:  No hallucinations, no delusions  Suicidal Thoughts:  No denies suicidal or self-injurious ideations, denies homicidal or violent ideations  Homicidal Thoughts:  No  Memory:  Recent and remote grossly intact  Judgement:  Other:  Improving  Insight:  Improving  Psychomotor Activity:  Normal  Concentration:  Good  Recall:  Good  Fund of Knowledge:Good  Language: Good  Akathisia:  Negative  Handed:  Right  AIMS (if indicated):     Assets:  Communication Skills Desire for Improvement Resilience  Sleep:  Number of Hours: 6.25  Cognition: WNL  ADL's:  Intact   Mental Status Per Nursing Assessment::   On Admission:  NA  Demographic Factors:  23 year old female, lives with boyfriend and infant son.  Senior in college  Loss Factors: Reports some fears/concerns regarding current coronavirus epidemic  Historical Factors: History of depression, anxiety,  remote history of cutting.  No prior history of suicide attempts  Risk Reduction Factors:   Responsible for children under 5 years of age, Sense of responsibility to family, Living with another person, especially a relative and Positive coping skills or problem solving skills  Continued Clinical Symptoms:  At this time patient is alert, attentive, well-groomed, describes mood as significantly improved.  States "I feel a lot better".  Affect is more reactive, smiles at times appropriately during session.  No thought disorder.  Denies any suicidal ideations, denies any homicidal ideations.  She she also specifically denies had any violent or any homicidal ideations towards her child .  No hallucinations, no delusions.  Future oriented, looking forward to seeing her family.  With her expressed consent in her presence I spoke with her boyfriend, [name], who has visited and who corroborates patient seems improved and back to baseline. Behavior on unit in good control, pleasant on approach Denies medication side effects.  We discussed increasing Prozac further to 20 mg daily but prefers current dose as she states she is tolerating it well and feeling "a lot better already".  Of note, patient is no longer breast-feeding.  I have reviewed diabetes management with hospitalist consultant and with diabetes care coordinator- CBGs have improved but remain elevated, today 252.  Recommendation is to increase basal/Lantus insulin to 25 units daily and to increase metformin to thousand grams twice daily.  Of note, patient knows how to self administer insulin as she has been on insulin before during pregnancy. Cognitive Features That Contribute To Risk:  No gross cognitive deficits noted upon discharge. Is alert , attentive, and oriented x  3   Suicide Risk:  Mild:  Suicidal ideation of limited frequency, intensity, duration, and specificity.  There are no identifiable plans, no associated intent, mild dysphoria  and related symptoms, good self-control (both objective and subjective assessment), few other risk factors, and identifiable protective factors, including available and accessible social support.  Follow-up Information    [facility] on 06/29/2018.   Why:  Appointment for medication management is Monday, 06/29/2018 at 12:00pm with Karel Jarvis, NP.Be sure to bring any discharge paperwork from this hospitalization, including  list of medications and to call to secure your appointment with a $20 deposit.  Contact information: [address] [place] [address] [phone]        [facility]. Go to.   Why:  For therapy services, walk-in hours are Monday-Friday from 8:00am-5:00pm. Be sure to bring any dicharge paperwork from this hospitalization.  Contact information: [address], [address] [place], [place] [address] Telephone: [phone] Fax: [phone]          Plan Of Care/Follow-up recommendations:  Activity:  As tolerated Diet:  Diabetic diet Tests:  NA Other:  See below  Patient is requesting discharge and there are no current grounds for involuntary commitment. She plans to return home.  Plans to follow-up as above. She reports she has an established endocrinologist at the [facility] endocrinology.  I have reviewed the importance of following up with specialist soon after discharge in order to continue appropriate management of DM.   Craige Cotta, MD 06/19/2018, 9:25 AM

## 2018-06-19 NOTE — Tx Team (Signed)
Interdisciplinary Treatment and Diagnostic Plan Update  06/19/2018 Time of Session:  Angela Barry Orders MRN: [id number]  Principal Diagnosis: MDD (major depressive disorder), recurrent severe, without psychosis (HCC)  Secondary Diagnoses: Principal Problem:   MDD (major depressive disorder), recurrent severe, without psychosis (HCC)   Current Medications:  Current Facility-Administered Medications  Medication Dose Route Frequency Provider Last Rate Last Dose  . acetaminophen (TYLENOL) tablet 650 mg  650 mg Oral Q6H PRN Laveda Abbe, NP      . alum & mag hydroxide-simeth (MAALOX/MYLANTA) 200-200-20 MG/5ML suspension 30 mL  30 mL Oral Q4H PRN Laveda Abbe, NP      . FLUoxetine (PROZAC) capsule 10 mg  10 mg Oral Daily Cobos, Rockey Situ, MD   10 mg at 06/19/18 0753  . hydrOXYzine (ATARAX/VISTARIL) tablet 25 mg  25 mg Oral TID PRN Laveda Abbe, NP   25 mg at 06/18/18 2105  . insulin aspart (novoLOG) injection 0-15 Units  0-15 Units Subcutaneous TID WC Tyrone Nine, MD   8 Units at 06/19/18 0603  . insulin aspart (novoLOG) injection 0-5 Units  0-5 Units Subcutaneous QHS Tyrone Nine, MD   4 Units at 06/18/18 2103  . insulin aspart (novoLOG) injection 5 Units  5 Units Subcutaneous TID WC Aldean Baker, NP   5 Units at 06/19/18 0602  . insulin glargine (LANTUS) injection 20 Units  20 Units Subcutaneous QHS Aldean Baker, NP   20 Units at 06/18/18 2104  . magnesium hydroxide (MILK OF MAGNESIA) suspension 30 mL  30 mL Oral Daily PRN Laveda Abbe, NP      . metFORMIN (GLUCOPHAGE) tablet 500 mg  500 mg Oral BID WC Laveda Abbe, NP   500 mg at 06/19/18 0753  . traZODone (DESYREL) tablet 50 mg  50 mg Oral QHS PRN Laveda Abbe, NP   50 mg at 06/18/18 2104   PTA Medications: Medications Prior to Admission  Medication Sig Dispense Refill Last Dose  . cetirizine (ZYRTEC) 10 MG tablet Take 10 mg by mouth daily.   06/16/2018 at Unknown time  . docusate  sodium (COLACE) 100 MG capsule Take 1 capsule (100 mg total) by mouth 2 (two) times daily as needed for mild constipation or moderate constipation. (Patient not taking: Reported on 04/23/2018) 30 capsule 2 Not Taking at Unknown time  . etonogestrel (NEXPLANON) 68 MG IMPL implant 1 each by Subdermal route once.     Marland Kitchen ibuprofen (ADVIL,MOTRIN) 200 MG tablet Take 400 mg by mouth daily as needed.   Past Week at Unknown time  . sertraline (ZOLOFT) 50 MG tablet Take 1 tablet (50 mg total) by mouth daily. (Patient not taking: Reported on 04/23/2018) 30 tablet 3 Not Taking at Unknown time  . sertraline (ZOLOFT) 50 MG tablet Take 50 mg by mouth daily.    06/16/2018 at Unknown time  . Vitamin D, Ergocalciferol, (DRISDOL) 50000 units CAPS capsule Take 1 capsule (50,000 Units total) by mouth every 7 (seven) days. (Patient not taking: Reported on 04/23/2018) 30 capsule 2 Not Taking at Unknown time    Patient Stressors: Educational concerns Other: birth of child  Patient Strengths: Ability for insight Average or above average intelligence Capable of independent living [employer] of knowledge Motivation for treatment/growth Supportive family/friends  Treatment Modalities: Medication Management, Group therapy, Case management,  1 to 1 session with clinician, Psychoeducation, Recreational therapy.   Physician Treatment Plan for Primary Diagnosis: MDD (major depressive disorder), recurrent severe, without psychosis (HCC) Long  Term Goal(s): Improvement in symptoms so as ready for discharge Improvement in symptoms so as ready for discharge   Short Term Goals: Ability to identify changes in lifestyle to reduce recurrence of condition will improve Ability to verbalize feelings will improve Ability to disclose and discuss suicidal ideas Ability to demonstrate self-control will improve Ability to identify and develop effective coping behaviors will improve Ability to identify triggers associated with substance  abuse/mental health issues will improve  Medication Management: Evaluate patient's response, side effects, and tolerance of medication regimen.  Therapeutic Interventions: 1 to 1 sessions, Unit Group sessions and Medication administration.  Evaluation of Outcomes: Adequate for Discharge  Physician Treatment Plan for Secondary Diagnosis: Principal Problem:   MDD (major depressive disorder), recurrent severe, without psychosis (HCC)  Long Term Goal(s): Improvement in symptoms so as ready for discharge Improvement in symptoms so as ready for discharge   Short Term Goals: Ability to identify changes in lifestyle to reduce recurrence of condition will improve Ability to verbalize feelings will improve Ability to disclose and discuss suicidal ideas Ability to demonstrate self-control will improve Ability to identify and develop effective coping behaviors will improve Ability to identify triggers associated with substance abuse/mental health issues will improve     Medication Management: Evaluate patient's response, side effects, and tolerance of medication regimen.  Therapeutic Interventions: 1 to 1 sessions, Unit Group sessions and Medication administration.  Evaluation of Outcomes: Adequate for Discharge   RN Treatment Plan for Primary Diagnosis: MDD (major depressive disorder), recurrent severe, without psychosis (HCC) Long Term Goal(s): Knowledge of disease and therapeutic regimen to maintain health will improve  Short Term Goals: Ability to participate in decision making will improve, Ability to verbalize feelings will improve, Ability to disclose and discuss suicidal ideas, Ability to identify and develop effective coping behaviors will improve and Compliance with prescribed medications will improve  Medication Management: RN will administer medications as ordered by provider, will assess and evaluate patient's response and provide education to patient for prescribed medication. RN  will report any adverse and/or side effects to prescribing provider.  Therapeutic Interventions: 1 on 1 counseling sessions, Psychoeducation, Medication administration, Evaluate responses to treatment, Monitor vital signs and CBGs as ordered, Perform/monitor CIWA, COWS, AIMS and Fall Risk screenings as ordered, Perform wound care treatments as ordered.  Evaluation of Outcomes: Adequate for Discharge   LCSW Treatment Plan for Primary Diagnosis: MDD (major depressive disorder), recurrent severe, without psychosis (HCC) Long Term Goal(s): Safe transition to appropriate next level of care at discharge, Engage patient in therapeutic group addressing interpersonal concerns.  Short Term Goals: Engage patient in aftercare planning with referrals and resources  Therapeutic Interventions: Assess for all discharge needs, 1 to 1 time with Social worker, Explore available resources and support systems, Assess for adequacy in community support network, Educate family and significant other(s) on suicide prevention, Complete Psychosocial Assessment, Interpersonal group therapy.  Evaluation of Outcomes: Adequate for Discharge   Progress in Treatment: Attending groups: Yes. Participating in groups: Yes. Taking medication as prescribed: Yes. Toleration medication: Yes. Family/Significant other contact made: No, will contact:  patient declined consent for collateral contacts Patient understands diagnosis: Yes. Discussing patient identified problems/goals with staff: Yes. Medical problems stabilized or resolved: Yes. Denies suicidal/homicidal ideation: Yes. Issues/concerns per patient self-inventory: No. Other:   New problem(s) identified:None   New Short Term/Long Term Goal(s): medication stabilization, elimination of SI thoughts, development of comprehensive mental wellness plan.    Patient Goals:  I need to find ways to cope  Discharge Plan or Barriers: Patient is discharging home with her  boyfriend and three month old son. She plans to [facility] with [facility] for [facility] management. She also plans to follow up with therapy services at [facility] for therapy services.   Reason for Continuation of Hospitalization: None   Estimated Length of Stay: Discharging, 06/19/2018  Attendees: Patient: 06/19/2018 9:37 AM  Physician: Dr. Nehemiah Massed, MD 06/19/2018 9:37 AM  Nursing: Arlyss Repress.Shela Commons RN 06/19/2018 9:37 AM  RN Care Manager: 06/19/2018 9:37 AM  Social Worker: Baldo Daub, Theresia Majors 06/19/2018 9:37 AM  Recreational Therapist:  06/19/2018 9:37 AM  Other: Marciano Sequin, NP  06/19/2018 9:37 AM  Other:  06/19/2018 9:37 AM  Other: 06/19/2018 9:37 AM    Scribe for Treatment Team: Maeola Sarah, LCSWA 06/19/2018 9:37 AM

## 2018-06-19 NOTE — Progress Notes (Signed)
Discharge note: Patient reviewed discharge paperwork with RN including prescriptions, follow up appointments, and lab work. Patient given the opportunity to ask questions. All concerns were addressed. All belongings were returned to patient. Denied SI/HI/AVH. Patient thanked staff for their care while at the hospital.  Patient was discharged to lobby with security waiting to driver her across to [facility]. Her car is in the parking lot.

## 2018-06-19 NOTE — Progress Notes (Signed)
Inpatient Diabetes Program Recommendations  AACE/ADA: New Consensus Statement on Inpatient Glycemic Control (2015)  Target Ranges:  Prepandial:   less than 140 mg/dL      Peak postprandial:   less than 180 mg/dL (1-2 hours)      Critically ill patients:  140 - 180 mg/dL   Lab Results  Component Value Date   GLUCAP 252 (H) 06/19/2018   HGBA1C 13.1 (H) 06/18/2018    Discharge medications Basaglar Kwikpen 25 units (order # [id number]) Metformin 1000 mg BID Insulin pen needles (order # [id number])  Patient already has glucose meter and supplies at home.  Thanks,  Christena Deem RN, MSN, BC-ADM Inpatient Diabetes Coordinator Team Pager [phone] (8a-5p)

## 2018-06-19 NOTE — Discharge Summary (Addendum)
Physician Discharge Summary Note  Patient:  Angela Barry is an 23 y.o., female MRN:  [id number] DOB:  1995-06-01 Patient phone:  [phone] (home)  Patient address:   [address] Angela [place] [address],  Total Time spent with patient: 15 minutes  Date of Admission:  06/17/2018 Date of Discharge: 06/19/18  Reason for Admission:  Suicidal ideation  Principal Problem: MDD (major depressive disorder), recurrent severe, without psychosis (HCC) Discharge Diagnoses: Principal Problem:   MDD (major depressive disorder), recurrent severe, without psychosis (HCC)   Past Psychiatric History: Per admission H&P: Prior history of depression, previously [facility] briefly [facility]ft [facility]. She stopped the Zoloft when she found out she was pregnant. History of cutting, last done years ago in high school. History of panic attacks years ago. Denies history of suicide attempts, hospitalizations, mania or psychosis. Denies history of violence. Denies history of alcohol or drug abuse.  Past Medical History:  Past Medical History:  Diagnosis Date  . Diabetes mellitus without complication [facility])     Past Surgical History:  Procedure Laterality Date  . CESAREAN SECTION N/A 03/09/2018   Procedure: CESAREAN SECTION;  Surgeon: Angela Phenix, MD;  Location: [facility] BIRTHING SUITES;  Service: Obstetrics;  Laterality: N/A;  . NO PAST SURGERIES     Family History:  Family History  Problem Relation Age of Onset  . Diabetes Father   . Diabetes Paternal Grandmother   . Diabetes Paternal Grandfather   . Hypertension Neg Hx    Family Psychiatric  History: Per admission H&P: Sister with social anxiety. Denies family history of suicides or alcohol/drug abuse. Social History:  Social History   Substance and Sexual Activity  Alcohol Use Not Currently     Social History   Substance and Sexual Activity  Drug Use Never    Social History   Socioeconomic History  .  Marital status: Single    Spouse name: Not on file  . Number of children: Not on file  . Years of education: Not on file  . Highest education level: Not on file  Occupational History  . Not on file  Social Needs  . Financial resource strain: Not on file  . Food insecurity:    Worry: Not on file    Inability: Not on file  . Transportation needs:    Medical: Not on file    Non-medical: Not on file  Tobacco Use  . Smoking status: Never Smoker  . Smokeless tobacco: Never Used  Substance and Sexual Activity  . Alcohol use: Not Currently  . Drug use: Never  . Sexual activity: Yes    Partners: Male  Lifestyle  . Physical activity:    Days per week: Not on file    Minutes per session: Not on file  . Stress: Not on file  Relationships  . Social connections:    Talks on phone: Not on file    Gets together: Not on file    Attends religious service: Not on file    Active member of club or organization: Not on file    Attends meetings of clubs or organizations: Not on file    Relationship status: Not on file  Other Topics Concern  . Not on file  Social History Narrative  . Not on file    Hospital Course:  From admission assessment 06/16/2018: Angela Barry is a 23 y.o. female who presents voluntarily to [facility]. Pt initially presented to [facility] care reporting SI &  was advised to go to [facility]. Pt is accompanied by her 54 month old son & her college friend, [employer] [facility]. Pt is reporting symptoms of depression and suicidal ideation. Pt reports she has had Depression with SI off and on for years. She had just started on an antidepressant, rx'ed by [facility], when she learned she was pregnant. She discontinued the Zoloft at that time. Pt is again feeling increase in Depression sx: isolating, anhedonia, feelings of worthlessness & guilt, tearfulness & irritability. Pt reports current suicidal ideation with plans of an intentional MVA. Past attempts include none. Pt denies homicidal  ideation/ history of violence. Pt denies AVH and other psychotic symptoms. Pt states current stressors include return of SI. Pt lives with her boyfrined, and supports include her friend, Angela [facility], & her parents. Pt denies hx of abuse and trauma. She reports she did intentionally cut on herself at 23 years old. No tx at that time or explanation given by pt for the cutting. Pt reports there is family hx of social anxiety- her sister. Pt is currently a senior at [employer].  Pt has good insight and judgment. Pt's memory is intact. Legal history includes no charges. Pt's OP history includes no tx. IP history includes no tx. Pt reports occasional-to-rare social alcohol use. No other drug abuse.  From admission SRA 06/17/2018: 23, single, lives with BF and son, has a three month old child, who is currently with patient's mother. She is a Higher education careers adviser. Currently not employed. Patient presented to ED voluntarily on 3/17 due to worsening depression. States she has been feeling more depressed recently, and cannot identify any specific triggers. Reports that over recent days she has had some intermittent suicidal ideations, with thoughts of crashing her car. She denies any psychotic symptoms. She states she has been feeling more anxious recently, mainly about the coronavirus epidemic. She has a child in December 2019 and is 3 months postpartum. Reports some neuro-vegetative symptoms- identifies sadness, some decreased energy, but describes normal appetite and denies anhedonia. She states she is not currently breastfeeding. Denies thoughts of harming her son.  Angela Barry was admitted for suicidal ideation at 3 months postpartum. She was started on Prozac. She participated in group therapy on the unit. She responded well to treatment with no adverse effects reported. Collateral information was obtained from patient's boyfriend [facility], who denied safety concerns for discharge. Angela Barry remained on the [facility] unit for 2  days. She stabilized with medication and therapy. She was discharged on the medications listed below. She has shown improvement with improved mood, affect, sleep, appetite, and interaction. She denies any SI/HI/AVH and contracts for safety, denies thoughts of harming baby. She agrees to [facility] (see below). Patient is provided with prescriptions for medications upon discharge. Hospital security is transporting her to her vehicle so she can drive home.  Angela Barry has a history of type 2 diabetes that was diagnosed during her recent pregnancy. She had been taking insulin during pregnancy but denied any treatment for diabetes since delivery three months ago. a1c on 06/18/2018 was 13.1. She was started on metformin, insulin Lantus, and sliding scale insulin ACHS, per hospitalist recommendations. Patient was advised of high a1c and risks of untreated diabetes, educated on importance of follow up for diabetes with a primary care provider as soon as possible after discharge. Patient states understanding.   Physical Findings: AIMS: Facial and Oral Movements Muscles of Facial Expression:  None, normal Lips and Perioral Area: None, normal Jaw: None, normal Tongue: None, normal,Extremity Movements Upper (arms, wrists, hands, fingers): None, normal Lower (legs, knees, ankles, toes): None, normal, Trunk Movements Neck, shoulders, hips: None, normal, Overall Severity Severity of abnormal movements (highest score from questions above): None, normal Incapacitation due to abnormal movements: None, normal Patient's awareness of abnormal movements (rate only patient's report): No Awareness, Dental Status Current problems with teeth and/or dentures?: No Does patient usually wear dentures?: No  CIWA:    COWS:     Musculoskeletal: Strength & Muscle Tone: within normal limits Gait & Station: normal Patient leans: N/A  Psychiatric Specialty  Exam: Physical Exam  Nursing note and vitals reviewed. Constitutional: She is oriented to person, place, and time. She appears well-developed and well-nourished.  Respiratory: Effort normal.  Musculoskeletal: Normal range of motion.  Neurological: She is alert and oriented to person, place, and time.  Psychiatric: Her behavior is normal.    Review of Systems  Constitutional: Negative.   Psychiatric/Behavioral: Positive for depression (improving). Negative for hallucinations, memory loss, substance abuse and suicidal ideas. The patient is not nervous/anxious and does not have insomnia.     Blood pressure 128/79, pulse (!) 113, temperature 98.3 F (36.8 C), temperature source Oral, resp. rate 20, height 5\' 2"  (1.575 m), weight 100.2 kg, currently breastfeeding.Body mass index is 40.42 kg/m.  See MD's discharge SRA     Have you used any form of tobacco in the last 30 days? (Cigarettes, Smokeless Tobacco, Cigars, and/or Pipes): No  Has this patient used any form of tobacco in the last 30 days? (Cigarettes, Smokeless Tobacco, Cigars, and/or Pipes)  No  Blood Alcohol level:  Lab Results  Component Value Date   ETH <10 06/16/2018    Metabolic Disorder Labs:  Lab Results  Component Value Date   HGBA1C 13.1 (H) 06/18/2018   MPG 329.27 06/18/2018   No results found for: PROLACTIN Lab Results  Component Value Date   CHOL 144 06/18/2018   TRIG 378 (H) 06/18/2018   HDL 27 (L) 06/18/2018   CHOLHDL 5.3 06/18/2018   VLDL 76 (H) 06/18/2018   LDLCALC 41 06/18/2018   LDLCALC 27 12/19/2017    See Psychiatric Specialty Exam and Suicide Risk Assessment completed by Attending Physician prior to discharge.  Discharge destination:  Home  Is patient on multiple antipsychotic therapies at discharge:  No   Has Patient had three or more failed trials of antipsychotic monotherapy by history:  No  Recommended Plan for Multiple Antipsychotic Therapies: NA  Discharge Instructions     Discharge instructions   Complete by:  As directed    Patient is instructed to take all prescribed medications as recommended. Report any side effects or adverse reactions to your outpatient psychiatrist. Patient is instructed to abstain from alcohol and illegal drugs while on prescription medications. In the event of worsening symptoms, patient is instructed to call the crisis hotline, 911, or go to the nearest emergency department for evaluation and treatment.     Allergies as of 06/19/2018   No Known Allergies     Medication List    STOP taking these medications   cetirizine 10 MG tablet Commonly known as:  ZYRTEC   docusate sodium 100 MG capsule Commonly known as:  COLACE   ibuprofen 200 MG tablet Commonly known as:  ADVIL,MOTRIN   sertraline 50 MG tablet Commonly known as:  Zoloft   Vitamin D (Ergocalciferol) 1.25 MG (50000 UT) Caps capsule Commonly known as:  DRISDOL   Zoloft 50 MG tablet Generic drug:  sertraline     TAKE these medications     Indication  FLUoxetine 10 MG capsule Commonly known as:  PROZAC Take 1 capsule (10 mg total) by mouth daily. For mood Start taking on:  June 20, 2018  Indication:  Mood   hydrOXYzine 25 MG tablet Commonly known as:  ATARAX/VISTARIL Take 1 tablet (25 mg total) by mouth 3 (three) times daily as needed for anxiety.  Indication:  Feeling Anxious   insulin glargine 100 UNIT/ML injection Commonly known as:  LANTUS Inject 0.25 mLs (25 Units total) into the skin at bedtime. For diabetes  Indication:  Type 2 Diabetes   metFORMIN 1000 MG tablet Commonly known as:  GLUCOPHAGE Take 1 tablet (1,000 mg total) by mouth 2 (two) times daily with a meal. For diabetes  Indication:  Type 2 Diabetes   Nexplanon 68 MG Impl implant Generic drug:  etonogestrel 1 each by Subdermal route once.  Indication:  Birth Control Treatment   traZODone 50 MG tablet Commonly known as:  DESYREL Take 1 tablet (50 mg total) by mouth at bedtime as  needed for sleep.  Indication:  Trouble Sleeping      Follow-up Information    [facility] 06[facility]29/2018.   Why:  Appointment for medication management is Monday, 06/29/2018 at 12:00pm with Karel Jarvis, NP.Be sure to bring any discharge paperwork from this hospitalization, including  list of medications and to call to secure your appointment with a $20 deposit.  Contact information: [address] [place] [address] [phone]        [facility]. Go to.   Why:  For therapy services, walk-in hours are Monday-Friday from 8:00am-5:00pm. Be sure to bring any dicharge paperwork from this hospitalization.  Contact information: [address], [address] [place], [place] [address] Telephone: [phone] Fax: [phone]          Follow-up recommendations: Follow up with primary care provider for diabetes management. Activity as tolerated. Diet as recommended by primary care physician. Keep all scheduled follow-up appointments as recommended.   Comments:   Patient is instructed to take all prescribed medications as recommended. Report any side effects or adverse reactions to your outpatient psychiatrist. Patient is instructed to abstain from alcohol and illegal drugs while on prescription medications. In the event of worsening symptoms, patient is instructed to call the crisis hotline, 911, or go to the nearest emergency department for evaluation and treatment.  Signed: Aldean Baker, NP 06/19/2018, 9:53 AM   Patient seen, Suicide Assessment Completed.  Disposition Plan Reviewed

## 2018-06-19 NOTE — Progress Notes (Signed)
Recreation Therapy Notes  Date:  3.20.20 Time: 0930 Location: 300 Hall Dayroom  Group Topic: Stress Management  Goal Area(s) Addresses:  Patient will identify positive stress management techniques. Patient will identify benefits of using stress management post d/c.  Intervention:  Stress Management   Activity :  Meditation.  LRT introduced patients to the stress management technique of meditation.  LRT played a meditation that focused on making the most of your day and the possibilities that can be had.  Patients were to follow along as meditation played to engage in activity.   Education:  Stress Management, Discharge Planning.   Education Outcome: Acknowledges Education  Clinical Observations/Feedback: Pt did not attend group.      Caroll Rancher, LRT/CTRS         Lillia Abed, Oval Moralez A 06/19/2018 11:06 AM

## 2018-06-19 NOTE — Progress Notes (Signed)
  [facility] Adult Case Management Discharge Plan :  Will you be returning to the same living situation after discharge:  Yes,  patient reports she is returning home with her boyfriend At discharge, do you have transportation home?: Yes,  patient's car is in [employer] parking lot; Security plans to transport patient to parking lot Do you have the ability to pay for your medications: Yes,  [employer]  Release of information consent forms completed and in the chart;  Patient's signature needed at discharge.  Patient to Follow up at: Follow-up Information    [facility] Go on 06/29/2018.   Why:  Appointment for medication management is Monday, 06/29/2018 at 12:00pm with Karel Jarvis, NP.Be sure to bring any discharge paperwork from this hospitalization, including  list of medications and to call to secure your appointment with a $20 deposit.  Contact information: [address] [place] [address] [phone]        [facility]. Go to.   Why:  For therapy services, walk-in hours are Monday-Friday from 8:00am-5:00pm. Be sure to bring any dicharge paperwork from this hospitalization.  Contact information: [address], [address] [place], [place] [address] Telephone: [phone] Fax: [phone]          Next level of care provider has access to [facility] Link:yes  Safety Planning and Suicide Prevention discussed: Yes,  with the patient   Have you used any form of tobacco in the last 30 days? (Cigarettes, Smokeless Tobacco, Cigars, and/or Pipes): No  Has patient been referred to the Quitline?: N/A patient is not a smoker  Patient has been referred for addiction treatment: N/A  Maeola Sarah, LCSWA 06/19/2018, 9:22 AM

## 2018-06-19 NOTE — Plan of Care (Signed)
  Problem: Education: Goal: Verbalization of understanding the information provided will improve Outcome: Progressing   Problem: Activity: Goal: Interest or engagement in activities will improve Outcome: Progressing Goal: Sleeping patterns will improve Outcome: Progressing   Problem: Coping: Goal: Ability to verbalize frustrations and anger appropriately will improve Outcome: Progressing Goal: Ability to demonstrate self-control will improve Outcome: Progressing

## 2020-11-21 ENCOUNTER — Other Ambulatory Visit (HOSPITAL_COMMUNITY)
Admission: RE | Admit: 2020-11-21 | Discharge: 2020-11-21 | Disposition: A | Discharge status: Home / Self Care | Payer: No Typology Code available for payment source | Source: Ambulatory Visit | Attending: Obstetrics and Gynecology | Admitting: Obstetrics and Gynecology

## 2020-11-21 ENCOUNTER — Ambulatory Visit (INDEPENDENT_AMBULATORY_CARE_PROVIDER_SITE_OTHER): Payer: No Typology Code available for payment source | Admitting: Obstetrics and Gynecology

## 2020-11-21 ENCOUNTER — Encounter: Payer: Self-pay | Admitting: Obstetrics and Gynecology

## 2020-11-21 ENCOUNTER — Other Ambulatory Visit: Payer: Self-pay

## 2020-11-21 VITALS — BP 139/93 | HR 99 | Ht 62.0 in | Wt 210.1 lb

## 2020-11-21 DIAGNOSIS — Z3046 Encounter for surveillance of implantable subdermal contraceptive: Secondary | ICD-10-CM | POA: Diagnosis not present

## 2020-11-21 DIAGNOSIS — Z30011 Encounter for initial prescription of contraceptive pills: Secondary | ICD-10-CM

## 2020-11-21 DIAGNOSIS — Z124 Encounter for screening for malignant neoplasm of cervix: Secondary | ICD-10-CM | POA: Insufficient documentation

## 2020-11-21 DIAGNOSIS — N926 Irregular menstruation, unspecified: Secondary | ICD-10-CM

## 2020-11-21 MED ORDER — SLYND 4 MG PO TABS: 1.0000 | ORAL_TABLET | Freq: Every day | ORAL | 11 refills | Status: AC

## 2020-11-21 NOTE — Progress Notes (Signed)
Patient presents for Nexplanon Removal. Nexplanon not due to expire until January 2023. She states that her periods are heavier with the Nexplanon which is the reason for wanting it removed.Patient states that she wants to start ocp. Patient has no other concerns.  Last Pap: 08/2017

## 2020-11-21 NOTE — Progress Notes (Signed)
25 yo P1 presenting for Nexplanon removal secondary to irregular vaginal bleeding. Patient has the Nexplanon since 04/2018. She plans to use contraceptive pills going forward  Blood pressure (!) 139/93, pulse 99, height 5\' 2"  (1.575 m), weight 210 lb 1.6 oz (95.3 kg), last menstrual period 10/23/2020, currently breastfeeding.   Removal Patient given informed consent for removal of her Nexplanon, time out was performed.  Signed copy in the chart.  Appropriate time out taken. Implanon site identified.  Area prepped in usual sterile fashon. One cc of 1% lidocaine was used to anesthetize the area at the distal end of the implant. A small stab incision was made right beside the implant on the distal portion.  The Nexplanon rod was grasped using hemostats and removed without difficulty.  There was less than 3 cc blood loss. There were no complications.  A small amount of antibiotic ointment and steri-strips were applied over the small incision.  A pressure bandage was applied to reduce any bruising.  The patient tolerated the procedure well and was given post procedure instructions.  Pap smear also collected with GC/Cl. Patient declined rest of STI testing

## 2020-11-23 LAB — CYTOLOGY - PAP
Adequacy: ABSENT
Chlamydia: NEGATIVE
Comment: NEGATIVE
Comment: NORMAL
Diagnosis: NEGATIVE
Neisseria Gonorrhea: NEGATIVE
